# Patient Record
Sex: Male | Born: 1995 | Race: Black or African American | Hispanic: No | Marital: Married | Smoking: Current every day smoker
Health system: Southern US, Community
[De-identification: ages and names within clinical notes are randomized; demographics above are authoritative.]

## PROBLEM LIST (undated history)

## (undated) DIAGNOSIS — G039 Meningitis, unspecified: Secondary | ICD-10-CM

---
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
## 2013-03-07 ENCOUNTER — Encounter (HOSPITAL_COMMUNITY): Payer: Self-pay

## 2013-03-07 ENCOUNTER — Emergency Department (HOSPITAL_COMMUNITY)
Admission: EM | Admit: 2013-03-07 | Discharge: 2013-03-07 | Disposition: A | Discharge status: Home / Self Care | Payer: BC Managed Care – PPO | Attending: Emergency Medicine | Admitting: Emergency Medicine

## 2013-03-07 ENCOUNTER — Emergency Department (HOSPITAL_COMMUNITY): Payer: BC Managed Care – PPO

## 2013-03-07 DIAGNOSIS — Z8661 Personal history of infections of the central nervous system: Secondary | ICD-10-CM | POA: Insufficient documentation

## 2013-03-07 DIAGNOSIS — M545 Low back pain, unspecified: Secondary | ICD-10-CM | POA: Insufficient documentation

## 2013-03-07 HISTORY — DX: Meningitis, unspecified: G03.9

## 2013-03-07 MED ORDER — METHOCARBAMOL 500 MG PO TABS: 500.0000 mg | ORAL_TABLET | Freq: Three times a day (TID) | ORAL | Status: DC

## 2013-03-07 MED ORDER — HYDROCODONE-ACETAMINOPHEN 5-325 MG PO TABS: ORAL_TABLET | ORAL | Status: DC

## 2013-03-07 NOTE — ED Notes (Signed)
Pt reports low back pain for years, pain worse today, denies any new or recent injury. Mother thinks may be related to lumbar puncture pt had as a child.

## 2013-03-07 NOTE — ED Notes (Signed)
Low back pain  , Was at school and called his mother to pick him up. No known injury. Increased pain with movement.

## 2013-03-10 NOTE — ED Provider Notes (Signed)
Medical screening examination/treatment/procedure(s) were performed by non-physician practitioner and as supervising physician I was immediately available for consultation/collaboration.  Donnetta Hutching, MD 03/10/13 [phone]

## 2013-03-10 NOTE — ED Provider Notes (Signed)
History     CSN: [id number]  Arrival date & time 03/07/13  1218   First MD Initiated Contact with Patient 03/07/13 1301      Chief Complaint  Patient presents with  . Back Pain    (Consider location/radiation/quality/duration/timing/severity/associated sxs/prior treatment) Patient is a 17 y.o. male presenting with back pain. The history is provided by the patient and a parent.  Back Pain Location:  Lumbar spine Quality:  Aching Radiates to:  Does not radiate Pain severity:  Moderate Pain is:  Same all the time Onset quality:  Gradual Timing:  Intermittent Progression:  Worsening Chronicity:  Chronic Context: not falling, not jumping from heights, not lifting heavy objects, not recent injury and not twisting   Relieved by:  Nothing Worsened by:  Bending, twisting and movement Ineffective treatments:  Lying down and NSAIDs Associated symptoms: no abdominal pain, no abdominal swelling, no bladder incontinence, no bowel incontinence, no chest pain, no dysuria, no fever, no headaches, no leg pain, no numbness, no paresthesias, no pelvic pain, no tingling and no weakness   Associated symptoms comment:  Mother states he had an LP as child and concerned that the procedure caused his chronic back pain   Past Medical History  Diagnosis Date  . Meningitis     History reviewed. No pertinent past surgical history.  No family history on file.  History  Substance Use Topics  . Smoking status: Never Smoker   . Smokeless tobacco: Not on file  . Alcohol Use: No      Review of Systems  Constitutional: Negative for fever.  Respiratory: Negative for shortness of breath.   Cardiovascular: Negative for chest pain.  Gastrointestinal: Negative for vomiting, abdominal pain, constipation and bowel incontinence.  Genitourinary: Negative for bladder incontinence, dysuria, hematuria, flank pain, decreased urine volume, difficulty urinating and pelvic pain.       No perineal numbness or  incontinence of urine or feces  Musculoskeletal: Positive for back pain. Negative for joint swelling.  Skin: Negative for rash.  Neurological: Negative for tingling, weakness, numbness, headaches and paresthesias.  All other systems reviewed and are negative.    Allergies  Review of patient's allergies indicates no known allergies.  Home Medications   Current Outpatient Rx  Name  Route  Sig  Dispense  Refill  . HYDROcodone-acetaminophen (NORCO/VICODIN) 5-325 MG per tablet      Take one-two tabs po q 4-6 hrs prn pain   20 tablet   0   . methocarbamol (ROBAXIN) 500 MG tablet   Oral   Take 1 tablet (500 mg total) by mouth 3 (three) times daily.   30 tablet   0     BP 116/58  Pulse 61  Temp(Src) 98 F (36.7 C) (Oral)  Resp 18  Ht 6' (1.829 m)  Wt 166 lb (75.297 kg)  BMI 22.51 kg/m2  SpO2 100%  Physical Exam  Nursing note and vitals reviewed. Constitutional: He is oriented to person, place, and time. He appears well-developed and well-nourished. No distress.  HENT:  Head: Normocephalic and atraumatic.  Neck: Normal range of motion. Neck supple.  Cardiovascular: Normal rate, regular rhythm, normal heart sounds and intact distal pulses.   No murmur heard. Pulmonary/Chest: Effort normal and breath sounds normal. No respiratory distress.  Musculoskeletal: He exhibits tenderness. He exhibits no edema.       Lumbar back: He exhibits tenderness, bony tenderness and pain. He exhibits normal range of motion, no swelling, no deformity, no laceration and normal pulse.  Back:  ttp of the lumbar spine and paraspinal muscles.    DP pulses are brisk and symmetrical.  Distal sensation intact.  Hip Flexors/Extensors are intact  Neurological: He is alert and oriented to person, place, and time. No cranial nerve deficit or sensory deficit. He exhibits normal muscle tone. Coordination and gait normal.  Reflex Scores:      Patellar reflexes are 2+ on the right side and 2+ on the  left side.      Achilles reflexes are 2+ on the right side and 2+ on the left side. Skin: Skin is warm and dry.    ED Course  Procedures (including critical care time)  Labs Reviewed - No data to display No results found. Dg Lumbar Spine Complete  03/07/2013   *RADIOLOGY REPORT*  Clinical Data: Low back pain worse today  LUMBAR SPINE - COMPLETE 4+ VIEW  Comparison: None  Findings: Osseous mineralization normal. Five non-rib bearing lumbar vertebrae. Vertebral body and disc space heights maintained. No acute fracture, subluxation or bone destruction. No spondylolysis. SI joints symmetric.  IMPRESSION: Normal exam.   Original Report Authenticated By: Ulyses Southward, M.D.    1. Low back pain       MDM   X-ray finding discussed with the mother.     Patient has ttp of the lumbar paraspinal muscles.  No focal neuro deficits on exam.  Ambulates with a steady gait.   Doubt emergent neurological or infectious process.    Mother agrees to close f/u with his PMD       Kamarie Veno L. Trisha Mangle, PA-C 03/10/13 1614

## 2013-11-27 ENCOUNTER — Encounter (HOSPITAL_COMMUNITY): Payer: Self-pay | Admitting: Emergency Medicine

## 2013-11-27 ENCOUNTER — Emergency Department (HOSPITAL_COMMUNITY)
Admission: EM | Admit: 2013-11-27 | Discharge: 2013-11-27 | Disposition: A | Discharge status: Home / Self Care | Payer: BC Managed Care – PPO | Attending: Emergency Medicine | Admitting: Emergency Medicine

## 2013-11-27 DIAGNOSIS — Z79899 Other long term (current) drug therapy: Secondary | ICD-10-CM | POA: Insufficient documentation

## 2013-11-27 DIAGNOSIS — X500XXA Overexertion from strenuous movement or load, initial encounter: Secondary | ICD-10-CM | POA: Insufficient documentation

## 2013-11-27 DIAGNOSIS — Y990 Civilian activity done for income or pay: Secondary | ICD-10-CM | POA: Insufficient documentation

## 2013-11-27 DIAGNOSIS — S39012A Strain of muscle, fascia and tendon of lower back, initial encounter: Secondary | ICD-10-CM

## 2013-11-27 DIAGNOSIS — Z8669 Personal history of other diseases of the nervous system and sense organs: Secondary | ICD-10-CM | POA: Insufficient documentation

## 2013-11-27 DIAGNOSIS — S335XXA Sprain of ligaments of lumbar spine, initial encounter: Secondary | ICD-10-CM | POA: Insufficient documentation

## 2013-11-27 DIAGNOSIS — X503XXA Overexertion from repetitive movements, initial encounter: Secondary | ICD-10-CM | POA: Insufficient documentation

## 2013-11-27 DIAGNOSIS — Y929 Unspecified place or not applicable: Secondary | ICD-10-CM | POA: Insufficient documentation

## 2013-11-27 DIAGNOSIS — Y9389 Activity, other specified: Secondary | ICD-10-CM | POA: Insufficient documentation

## 2013-11-27 MED ORDER — IBUPROFEN 600 MG PO TABS: 600.0000 mg | ORAL_TABLET | Freq: Four times a day (QID) | ORAL | Status: DC | PRN

## 2013-11-27 MED ORDER — CYCLOBENZAPRINE HCL 5 MG PO TABS: 5.0000 mg | ORAL_TABLET | Freq: Three times a day (TID) | ORAL | Status: DC | PRN

## 2013-11-27 MED ORDER — IBUPROFEN 400 MG PO TABS
400.0000 mg | ORAL_TABLET | Freq: Once | ORAL | Status: AC
  Administered 2013-11-27: 400 mg via ORAL
  Filled 2013-11-27: qty 1

## 2013-11-27 MED ORDER — CYCLOBENZAPRINE HCL 10 MG PO TABS
10.0000 mg | ORAL_TABLET | Freq: Once | ORAL | Status: AC
  Administered 2013-11-27: 10 mg via ORAL
  Filled 2013-11-27: qty 1

## 2013-11-27 NOTE — ED Notes (Signed)
Pt c/o lower back pain x2-3 days. 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 Denies injury. Pain worse with movement.

## 2013-11-27 NOTE — ED Provider Notes (Signed)
  Medical screening examination/treatment/procedure(s) were performed by non-physician practitioner and as supervising physician I was immediately available for consultation/collaboration.     Gerhard Munchobert Torsha Lemus, MD 11/27/13 [phone]

## 2013-11-27 NOTE — Discharge Instructions (Signed)
Lumbosacral Strain Lumbosacral strain is a strain of any of the parts that make up your lumbosacral vertebrae. Your lumbosacral vertebrae are the bones that make up the lower third of your backbone. Your lumbosacral vertebrae are held together by muscles and tough, fibrous tissue (ligaments).  CAUSES  A sudden blow to your back can cause lumbosacral strain. Also, anything that causes an excessive stretch of the muscles in the low back can cause this strain. This is typically seen when people exert themselves strenuously, fall, lift heavy objects, bend, or crouch repeatedly. RISK FACTORS  Physically demanding work.  Participation in pushing or pulling sports or sports that require sudden twist of the back (tennis, golf, baseball).  Weight lifting.  Excessive lower back curvature.  Forward-tilted pelvis.  Weak back or abdominal muscles or both.  Tight hamstrings. SIGNS AND SYMPTOMS  Lumbosacral strain may cause pain in the area of your injury or pain that moves (radiates) down your leg.  DIAGNOSIS Your health care provider can often diagnose lumbosacral strain through a physical exam. In some cases, you may need tests such as X-ray exams.  TREATMENT  Treatment for your lower back injury depends on many factors that your clinician will have to evaluate. However, most treatment will include the use of anti-inflammatory medicines. HOME CARE INSTRUCTIONS   Avoid hard physical activities (tennis, racquetball, waterskiing) if you are not in proper physical condition for it. This may aggravate or create problems.  If you have a back problem, avoid sports requiring sudden body movements. Swimming and walking are generally safer activities.  Maintain good posture.  Maintain a healthy weight.  For acute conditions, you may put ice on the injured area.  Put ice in a plastic bag.  Place a towel between your skin and the bag.  Leave the ice on for 20 minutes, 2 3 times a day.  When the  low back starts healing, stretching and strengthening exercises may be recommended. SEEK MEDICAL CARE IF:  Your back pain is getting worse.  You experience severe back pain not relieved with medicines. SEEK IMMEDIATE MEDICAL CARE IF:   You have numbness, tingling, weakness, or problems with the use of your arms or legs.  There is a change in bowel or bladder control.  You have increasing pain in any area of the body, including your belly (abdomen).  You notice shortness of breath, dizziness, or feel faint.  You feel sick to your stomach (nauseous), are throwing up (vomiting), or become sweaty.  You notice discoloration of your toes or legs, or your feet get very cold. MAKE SURE YOU:   Understand these instructions.  Will watch your condition.  Will get help right away if you are not doing well or get worse. Document Released: 07/23/2005 Document Revised: 08/03/2013 Document Reviewed: 06/01/2013 [facility] Patient Information 2014 [place], [place].    Emergency Department Resource Guide 1) Find a Doctor and Pay Out of Pocket Although you won't have to find out who is covered by your insurance plan, it is a good idea to ask around and get recommendations. You will then need to call the office and see if the doctor you have chosen will accept you as a new patient and what types of options they offer for patients who are self-pay. Some doctors offer discounts or will set up payment plans for their patients who do not have insurance, but you will need to ask so you aren't surprised when you get to your appointment.  2) Contact Your Local  Health Department Not all health departments have doctors that can see patients for sick visits, but many do, so it is worth a call to see if yours does. If you don't know where your local health department is, you can check in your phone book. The CDC also has a tool to help you locate your state's health department, and many state websites also have  listings of all of their local health departments.  3) Find a Walk-in Clinic If your illness is not likely to be very severe or complicated, you may want to try a walk in clinic. These are popping up all over the country in pharmacies, drugstores, and shopping centers. They're usually staffed by nurse practitioners or physician assistants that have been trained to treat common illnesses and complaints. They're usually fairly quick and inexpensive. However, if you have serious medical issues or chronic medical problems, these are probably not your best option.  No Primary Care Doctor: - Call Health Connect at  [phone] - they can help you locate a primary care doctor that  accepts your insurance, provides certain services, etc. - Physician Referral Service- [phone]  Chronic Pain Problems: Organization         Address  Phone   Notes  [facility]  [phone] Patients need to be referred by their primary care doctor.   Medication Assistance: Organization         Address  Phone   Notes  [facility] Medication [facility] [address] [place], [place] [address] [phone] --Must be a resident of [place] [facility] [place] -- Must have NO insurance coverage whatsoever (no Medicaid/ Medicare, etc.) -- The pt. MUST have a primary care doctor that directs their care regularly and follows them in the community   MedAssist  [phone]   [employer]  [phone]    Agencies that provide inexpensive medical care: Organization         Address  Phone   Notes  [facility]  [phone]   [facility]    [phone]   [facility] [address] [place], [place] [address] [phone]   [facility] [address], [place] [phone]   Planned Parenthood    [phone]   [facility]    [phone]   [facility]  [address], [place] Phone:  [phone], Fax:  [phone] Hours of Operation:  9 am - 6 pm, M-F.  Also accepts Medicaid/Medicare and self-pay.  [facility]  [address], [place] Phone: [phone], Fax: [phone]) [phone]. Hours of Operation:  8:30 am - 5:30 pm, M-F.  Also accepts Medicaid and self-pay.  [facility] [place] [address], [place] Phone: [phone]) [phone]   [facility] [address], [place], [place] [phone], Ext. [phone] Mondays & Thursdays: 7-9 AM.  First 15 patients are seen on a first come, first serve basis.    Medicaid-accepting [facility] Providers:  Organization         Address  Phone   Notes  [facility] [address], [place] [phone] Also accepts self-pay patients.  [facility] [address], [place]  [phone]   [facility] [address], [place] [phone]  [place] [address] [place] [phone]   Lucianne Lei [address], [place]   [phone] Only accepts [place] Access [place] patients after they have their name applied to their card.   Self-Pay (no insurance) in [facility]:  Organization         Address  Phone   Notes  Sickle Cell Patients, [facility] [place] [phone]   [facility] [place] [phone]   [facility] [place]  [place], [place], [place] [phone]   [facility]/Dr. Osei-Bonsu  [address], [place] [address], [place] [phone] Phone number for both [place] and [place] locations is the same.  [facility] [address] [place] [phone]   [facility] [address],  [place] or [address] [phone]   [facility] [address], [place] [phone], phone; [phone], fax Sees patients 1st and 3rd Saturday of every month.  Must not qualify for public or private insurance (i.e. Medicaid, Medicare, [place] Health Choice, Veterans' Benefits)  Household income should be no more than 200% of the poverty level The clinic cannot treat you if you are pregnant or think you are pregnant  Sexually transmitted diseases are not treated at the clinic.    Dental Care: Organization         Address  Phone  Notes  [facility] [place] [phone] Accepts children up to age 14 who are enrolled in [place] or [place]; pregnant women with a Medicaid card; and children who have applied for Medicaid or [place] Health Choice, but were declined, whose parents can pay a reduced fee at time of service.  [facility]  [address], [place] [phone] Accepts children up to age 61 who are enrolled in [place] or [place]; pregnant women with a Medicaid card; and children who have applied for Medicaid or [place] Health Choice, but were declined, whose parents can pay a reduced fee at time of service.  [facility] Access PRO[facility]  [place] [phone] Patients are seen by appointment only. Walk-ins are not accepted. [place] will see patients 20 years of age and older. Monday - Tuesday (8am-5pm) Most Wednesdays (8:30-5pm) $30 per visit, cash only  [facility] Adult Dental Access PROGRAM  [address], [facility] [phone] Patients are seen by appointment only. Walk-ins are not accepted. [place] will see patients 61 years of age and older. One Wednesday Evening (Monthly: Volunteer Based).  $30 per visit, cash only  [place]  [phone] for adults; Children under age 8, call Graduate Pediatric Dentistry at [phone]. Children aged 28-14, please call [phone] to request a pediatric application.  Dental services are provided in all areas of dental care including fillings, crowns and bridges, complete and partial dentures, implants, gum treatment, root canals, and extractions. Preventive care is also provided. Treatment is provided to both adults and children. Patients are selected via a lottery and there is often a waiting list.   [facility] [address] [place]  [phone] [email or web address]  [facility] [address] [place], [place] [phone], Ext. [phone] Second and Fourth Thursday of each month, opens at 6:30 AM; Clinic ends at 9 AM.  Patients are seen on a first-come first-served basis, and a limited number are seen during each clinic.   [facility]  [address] Hillard Danker [place], [place] [phone]   Eligibility Requirements You must have lived in [place], [place], or [place] counties for at least the last three months.   You cannot be eligible for state or federal sponsored [employer], including [employer], [place], or [employer].   You generally cannot be eligible for healthcare insurance through your employer.    How to apply: Eligibility screenings are held every Tuesday and Wednesday afternoon from 1:00 pm until 4:00 pm. You do not need an appointment for the interview!  [facility] [address] [place], [place]   [place]  [place] Department  [place]  [phone]    Behavioral Health Resources in the Community: Intensive Outpatient Programs Organization         Address  Phone  Notes  [facility] [place]. [address] [place], [place] [phone]     [facility] Outpatient [address] [place], [place]   ADS: Alcohol [facility] Drug S[facility] [address] [place], [place]   [place] [address],  [place], [place] or [phone]   Substance Abuse Resources Organization         Address  Phone  Notes  Alcohol and Drug Services  [phone]   [place]  [phone]   [place]   Chinita Pester  [phone]   Residential & Outpatient Substance Abuse Program  [phone]   Psychological Services Organization         Address  Phone  Notes  [facility] [place]  [place]  [phone]   [place] [address] [place] or [phone]    Mobile Crisis Teams Organization         Address  Phone  Notes  [facility], Mobile Crisis Care Unit  [phone]   [facility]  [address] [place], [place]   [place] Levels [address] [place] [phone]    Self-Help/Support Groups Organization         Address  Phone             Notes  [place]. of [place] - variety of support groups  [place] Call for more information  Narcotics Anonymous (NA), [facility] [address], [employer] [place]  2 meetings at this location   Special educational needs teacher         Address  Phone  Notes  [facility] [place],    [place]  [phone]   [place]  [address] [place] [id number], [place], [place]   [place], [place] [phone] Admissions: 8am-3pm M-F  Incentives Substance [place] [address],    [place], [place] [phone]   [facility] [address] Jadene Pierini [place], [place]   [facility] [employer] [facility] [address]  [place],  [place]   [facility] - Intensive Outpatient [phone]  [address], [place], [place] [phone]   [facility] ([facility] [address]  [place], [place] [phone] or [phone]   [facility] (RTS) [address] [place], [place] [phone] Accepts Medicaid  [facility] [place] [address]  [place] [phone] Substance Abuse/Addiction Treatment   [facility] Organization         Address  Phone  Notes  [facility]  [phone]   Angie Fava, PhD [address] Ervin Knack [place], [place]   [phone] or [phone]   [facility]   [address] [place], [place] [phone]   [facility] [address] [place], [place] [phone] Insurance/Medicaid/sponsorship through [facility] and Families [address]                                    [place], [place] [phone] Therapy/tele-psych/case  [facility] [address], [place] [phone]    Dr. Lolly Mustache  [phone]   [facility]  United Way [facility] Dept. 1) [address] [place] 2) [address], [place] 3)  [address], [place] [phone]  [phone]   [facility] Child Abuse Hotline 862-715-6125 or 5801956927 (After Hours)          Do not drive within 4 hours of taking flexeril as this may make you drowsy.  Avoid lifting,  Bending,  Twisting or any other activity that worsens your pain over the next week.  Apply an  icepack  to your lower back for 10-15 minutes every 2 hours for the next 2 days.  You should get rechecked if your symptoms are not better over the next 5 days,  Or you develop increased pain,  Weakness in your leg(s) or loss of bladder or bowel function - these are symptoms of a worse injury.

## 2013-11-27 NOTE — ED Provider Notes (Signed)
CSN: [id number]     Arrival date & time 11/27/13  0907 History  This chart was scribed for non-physician practitioner, Burgess Amor, PA-C,working with Gerhard Munch, MD, by Karle Plumber, ED Scribe.  This patient was seen in room APA07/APA07 and the patient's care was started at 9:26 AM.  Chief Complaint  Patient presents with  . Back Pain   The history is provided by the patient. No language interpreter was used.   HPI Comments:  Samuel Lane is a 18 y.o. male who presents to the Emergency Department complaining of constant moderate back pain that started approximately two days ago. Pt states he works at [employer] and often lifts heavy objects on average of 50-60 pounds each. He describes the pain as sharp pain that radiated once into his upper left thigh. He states that he has had similar symptoms before secondary to working. Pt reports any type of movement, especially bending over, makes his pain worse. He states he took an Aleve this morning and an Ibuprofen yesterday with no relief. Pt denies numbness, tingling, weakness of the lower extremities, bowel or bladder incontinence, or any other urinary symptoms. Pt denies any known injury or trauma.     Past Medical History  Diagnosis Date  . Meningitis    History reviewed. No pertinent past surgical history. History reviewed. No pertinent family history. History  Substance Use Topics  . Smoking status: Never Smoker   . Smokeless tobacco: Not on file  . Alcohol Use: No    Review of Systems  Constitutional: Negative for fever.  Respiratory: Negative for shortness of breath.   Cardiovascular: Negative for chest pain and leg swelling.  Gastrointestinal: Negative for abdominal pain, constipation and abdominal distention.  Genitourinary: Negative for dysuria, urgency, frequency, flank pain and difficulty urinating.  Musculoskeletal: Positive for back pain. Negative for gait problem and joint swelling.  Skin: Negative for rash.   Neurological: Negative for weakness and numbness.    Allergies  Review of patient's allergies indicates no known allergies.  Home Medications   Current Outpatient Rx  Name  Route  Sig  Dispense  Refill  . cyclobenzaprine (FLEXERIL) 5 MG tablet   Oral   Take 1 tablet (5 mg total) by mouth 3 (three) times daily as needed for muscle spasms.   15 tablet   0   . HYDROcodone-acetaminophen (NORCO/VICODIN) 5-325 MG per tablet      Take one-two tabs po q 4-6 hrs prn pain   20 tablet   0   . ibuprofen (ADVIL,MOTRIN) 600 MG tablet   Oral   Take 1 tablet (600 mg total) by mouth every 6 (six) hours as needed.   30 tablet   0   . methocarbamol (ROBAXIN) 500 MG tablet   Oral   Take 1 tablet (500 mg total) by mouth 3 (three) times daily.   30 tablet   0    Triage Vitals: BP 135/64  Pulse 65  Temp(Src) 98.3 F (36.8 C) (Oral)  Resp 18  SpO2 100% Physical Exam  Nursing note and vitals reviewed. Constitutional: He appears well-developed and well-nourished.  HENT:  Head: Normocephalic.  Eyes: Conjunctivae are normal.  Neck: Normal range of motion. Neck supple.  Cardiovascular: Normal rate and intact distal pulses.   Pedal pulses normal.  Pulmonary/Chest: Effort normal.  Abdominal: Soft. Bowel sounds are normal. He exhibits no distension and no mass.  Musculoskeletal: Normal range of motion. He exhibits no edema.       Lumbar back:  He exhibits tenderness (mid-lumbar and left paralumbar). He exhibits no swelling, no edema and no spasm.  Neurological: He is alert. He has normal strength. He displays no atrophy and no tremor. No sensory deficit. Gait normal.  Reflex Scores:      Patellar reflexes are 2+ on the right side and 2+ on the left side.      Achilles reflexes are 2+ on the right side and 2+ on the left side. No strength deficit noted in hip and knee flexor and extensor muscle groups.  Ankle flexion and extension intact.  Skin: Skin is warm and dry.  Psychiatric: He has  a normal mood and affect.    ED Course  Procedures (including critical care time) DIAGNOSTIC STUDIES: Oxygen Saturation is 100% on RA, normal by my interpretation.   COORDINATION OF CARE: 9:34 AM- Advised pt to continue taking OTC Ibuprofen 600 mg every 6 hours scheduled. Will prescribe a muscle relaxer. Advised pt to apply heating pad or soak in hot baths to relieve symptoms. Will refer pt to orthopedic specialist if symptoms do not improve within 4-5 days. Will provide work note for light duty of lifting no more than 20 pounds. Will give pain medication prior to discharge. Pt verbalizes understanding and agrees to plan.  Medications  ibuprofen (ADVIL,MOTRIN) tablet 400 mg (not administered)  cyclobenzaprine (FLEXERIL) tablet 10 mg (not administered)    Labs Review Labs Reviewed - No data to display Imaging Review No results found.  EKG Interpretation   None       MDM   1. Lumbar strain      I personally performed the services described in this documentation, which was scribed in my presence. The recorded information has been reviewed and is accurate.  I personally performed the services described in this documentation, which was scribed in my presence. The recorded information has been reviewed and is accurate.     Burgess AmorJulie Maicey Barrientez, PA-C 11/27/13 1003

## 2014-04-25 ENCOUNTER — Encounter (HOSPITAL_COMMUNITY): Payer: Self-pay | Admitting: Emergency Medicine

## 2014-04-25 ENCOUNTER — Emergency Department (HOSPITAL_COMMUNITY)
Admission: EM | Admit: 2014-04-25 | Discharge: 2014-04-25 | Disposition: A | Discharge status: Home / Self Care | Payer: BC Managed Care – PPO | Attending: Emergency Medicine | Admitting: Emergency Medicine

## 2014-04-25 DIAGNOSIS — S335XXA Sprain of ligaments of lumbar spine, initial encounter: Secondary | ICD-10-CM | POA: Insufficient documentation

## 2014-04-25 DIAGNOSIS — Y929 Unspecified place or not applicable: Secondary | ICD-10-CM | POA: Insufficient documentation

## 2014-04-25 DIAGNOSIS — X58XXXA Exposure to other specified factors, initial encounter: Secondary | ICD-10-CM | POA: Insufficient documentation

## 2014-04-25 DIAGNOSIS — S39012A Strain of muscle, fascia and tendon of lower back, initial encounter: Secondary | ICD-10-CM

## 2014-04-25 DIAGNOSIS — Y939 Activity, unspecified: Secondary | ICD-10-CM | POA: Insufficient documentation

## 2014-04-25 MED ORDER — IBUPROFEN 400 MG PO TABS
600.0000 mg | ORAL_TABLET | Freq: Once | ORAL | Status: AC
  Administered 2014-04-25: 600 mg via ORAL
  Filled 2014-04-25: qty 2

## 2014-04-25 MED ORDER — OXYCODONE-ACETAMINOPHEN 5-325 MG PO TABS
1.0000 | ORAL_TABLET | Freq: Once | ORAL | Status: AC
  Administered 2014-04-25: 1 via ORAL
  Filled 2014-04-25: qty 1

## 2014-04-25 MED ORDER — IBUPROFEN 600 MG PO TABS: 600.0000 mg | ORAL_TABLET | Freq: Four times a day (QID) | ORAL | Status: DC | PRN

## 2014-04-25 MED ORDER — CYCLOBENZAPRINE HCL 10 MG PO TABS: 10.0000 mg | ORAL_TABLET | Freq: Three times a day (TID) | ORAL | Status: DC | PRN

## 2014-04-25 NOTE — ED Notes (Signed)
Pt has c/o of lower back pain x 1 day.

## 2014-04-25 NOTE — Discharge Instructions (Signed)
Back Exercises °Back exercises help treat and prevent back injuries. The goal of back exercises is to increase the strength of your abdominal and back muscles and the flexibility of your back. These exercises should be started when you no longer have back pain. Back exercises include: °· Pelvic Tilt. Lie on your back with your knees bent. Tilt your pelvis until the lower part of your back is against the floor. Hold this position 5 to 10 sec and repeat 5 to 10 times. °· Knee to Chest. Pull first 1 knee up against your chest and hold for 20 to 30 seconds, repeat this with the other knee, and then both knees. This may be done with the other leg straight or bent, whichever feels better. °· Sit-Ups or Curl-Ups. Bend your knees 90 degrees. Start with tilting your pelvis, and do a partial, slow sit-up, lifting your trunk only 30 to 45 degrees off the floor. Take at least 2 to 3 seconds for each sit-up. Do not do sit-ups with your knees out straight. If partial sit-ups are difficult, simply do the above but with only tightening your abdominal muscles and holding it as directed. °· Hip-Lift. Lie on your back with your knees flexed 90 degrees. Push down with your feet and shoulders as you raise your hips a couple inches off the floor; hold for 10 seconds, repeat 5 to 10 times. °· Back arches. Lie on your stomach, propping yourself up on bent elbows. Slowly press on your hands, causing an arch in your low back. Repeat 3 to 5 times. Any initial stiffness and discomfort should lessen with repetition over time. °· Shoulder-Lifts. Lie face down with arms beside your body. Keep hips and torso pressed to floor as you slowly lift your head and shoulders off the floor. °Do not overdo your exercises, especially in the beginning. Exercises may cause you some mild back discomfort which lasts for a few minutes; however, if the pain is more severe, or lasts for more than 15 minutes, do not continue exercises until you see your caregiver.  Improvement with exercise therapy for back problems is slow.  °See your caregivers for assistance with developing a proper back exercise program. °Document Released: 11/20/2004 Document Revised: 01/05/2012 Document Reviewed: 08/14/2011 °ExitCare® Patient Information ©2015 [employer]. This information is not intended to replace advice given to you by your health care provider. Make sure you discuss any questions you have with your health care provider. ° °Back Pain, Adult °Low back pain is very common. About 1 in 5 people have back pain. The cause of low back pain is rarely dangerous. The pain often gets better over time. About half of people with a sudden onset of back pain feel better in just 2 weeks. About 8 in 10 people feel better by 6 weeks.  °CAUSES °Some common causes of back pain include: °· Strain of the muscles or ligaments supporting the spine. °· Wear and tear (degeneration) of the spinal discs. °· Arthritis. °· Direct injury to the back. °DIAGNOSIS °Most of the time, the direct cause of low back pain is not known. However, back pain can be treated effectively even when the exact cause of the pain is unknown. Answering your caregiver's questions about your overall health and symptoms is one of the most accurate ways to make sure the cause of your pain is not dangerous. If your caregiver needs more information, he or she may order lab work or imaging tests (X-rays or MRIs). However, even if imaging tests show changes in your   back, this usually does not require surgery. °HOME CARE INSTRUCTIONS °For many people, back pain returns. Since low back pain is rarely dangerous, it is often a condition that people can learn to manage on their own.  °· Remain active. It is stressful on the back to sit or stand in one place. Do not sit, drive, or stand in one place for more than 30 minutes at a time. Take short walks on level surfaces as soon as pain allows. Try to increase the length of time you walk each  day. °· Do not stay in bed. Resting more than 1 or 2 days can delay your recovery. °· Do not avoid exercise or work. Your body is made to move. It is not dangerous to be active, even though your back may hurt. Your back will likely heal faster if you return to being active before your pain is gone. °· Pay attention to your body when you  bend and lift. Many people have less discomfort when lifting if they bend their knees, keep the load close to their bodies, and avoid twisting. Often, the most comfortable positions are those that put less stress on your recovering back. °· Find a comfortable position to sleep. Use a firm mattress and lie on your side with your knees slightly bent. If you lie on your back, put a pillow under your knees. °· Only take over-the-counter or prescription medicines as directed by your caregiver. Over-the-counter medicines to reduce pain and inflammation are often the most helpful. Your caregiver may prescribe muscle relaxant drugs. These medicines help dull your pain so you can more quickly return to your normal activities and healthy exercise. °· Put ice on the injured area. °¨ Put ice in a plastic bag. °¨ Place a towel between your skin and the bag. °¨ Leave the ice on for 15-20 minutes, 03-04 times a day for the first 2 to 3 days. After that, ice and heat may be alternated to reduce pain and spasms. °· Ask your caregiver about trying back exercises and gentle massage. This may be of some benefit. °· Avoid feeling anxious or stressed. Stress increases muscle tension and can worsen back pain. It is important to recognize when you are anxious or stressed and learn ways to manage it. Exercise is a great option. °SEEK MEDICAL CARE IF: °· You have pain that is not relieved with rest or medicine. °· You have pain that does not improve in 1 week. °· You have new symptoms. °· You are generally not feeling well. °SEEK IMMEDIATE MEDICAL CARE IF:  °· You have pain that radiates from your back into  your legs. °· You develop new bowel or bladder control problems. °· You have unusual weakness or numbness in your arms or legs. °· You develop nausea or vomiting. °· You develop abdominal pain. °· You feel faint. °Document Released: 10/13/2005 Document Revised: 04/13/2012 Document Reviewed: 03/03/2011 °ExitCare® Patient Information ©2015 [employer]. This information is not intended to replace advice given to you by your health care provider. Make sure you discuss any questions you have with your health care provider. ° ° °Emergency Department Resource Guide °1) Find a Doctor and Pay Out of Pocket °Although you won't have to find out who is covered by your insurance plan, it is a good idea to ask around and get recommendations. You will then need to call the office and see if the doctor you have chosen will accept you as a new patient and what types of options they offer for patients who are self-pay. Some doctors offer discounts or will   set up payment plans for their patients who do not have insurance, but you will need to ask so you aren't surprised when you get to your appointment. ° °2) Contact Your Local Health Department °Not all health departments have doctors that can see patients for sick visits, but many do, so it is worth a call to see if yours does. If you don't know where your local health department is, you can check in your phone book. The CDC also has a tool to help you locate your state's health department, and many state websites also have listings of all of their local health departments. ° °3) Find a Walk-in Clinic °If your illness is not likely to be very severe or complicated, you may want to try a walk in clinic. These are popping up all over the country in pharmacies, drugstores, and shopping centers. They're usually staffed by nurse practitioners or physician assistants that have been trained to treat common illnesses and complaints. They're usually fairly quick and inexpensive. However,  if you have serious medical issues or chronic medical problems, these are probably not your best option. ° °No Primary Care Doctor: °- Call [facility] at  832-8000 - they can help you locate a primary care doctor that  accepts your insurance, provides certain services, etc. °- Physician Referral Service- 1-800-533-3463 ° °Chronic Pain Problems: °Organization         Address  Phone   Notes  °[facility]  (336) 297-2271 Patients need to be referred by their primary care doctor.  ° °Medication Assistance: °Organization         Address  Phone   Notes  °[place] [facility] [address] °[place], [place] [address] (336) 641-8030 --Must be a resident of [place] °-- Must have NO insurance coverage whatsoever (no Medicaid/ Medicare, etc.) °-- The pt. MUST have a primary care doctor that directs their care regularly and follows them in the community °  °[facility]  (866) 331-1348   °[employer]  (888) 892-1162   ° °Agencies that provide inexpensive medical care: °Organization         Address  Phone   Notes  °[facility]  (336) 832-8035   °[facility]    (336) 832-7272   °[facility] [address] °[place], [place] [address] (336) 832-4777   °[facility] [address], °[place] (336) 271-4999   °[facility]    (336) 373-0678   °[facility]    (336) 272-1050   °[facility] ° [address], [place] Phone:  (336) 832-4444, Fax:  (336) 832-4440 Hours of Operation:  9 am - 6 pm, M-F.  Also accepts Medicaid/Medicare and self-pay.  °[facility] ° [address], [place] Phone: (336) 832-3150, Fax: (336) 832-3151. Hours of Operation:  8:30 am - 5:30 pm, M-F.  Also accepts Medicaid and self-pay.  °[facility] [place] [address], [place] Phone: (336) 878-6027   °[facility] [address], [place], [place] (336)723-1848, Ext. 123 Mondays & Thursdays: 7-9 AM.  First 15 patients are seen on a first come, first serve basis. °  ° °Medicaid-accepting [place] Providers: ° °Organization         Address  Phone   Notes  °[facility] [address],  (336) 641-2100   Also accepts self-pay patients.  °[facility] [address], [place] ° (336) 856-9996   °[facility] [address], [place] (336) 288-8857   °[facility] [address] [place] [address], [place] (336) 299-7000   °Veita Bland [address], [place]  ° (336) 373-1557 Only accepts [place] Access Medicaid patients after they have their name applied to their card.  ° °Self-Pay (no insurance) in [place]: ° °Organization         Address  Phone   Notes  °Sickle Cell Patients, [facility] [address], [place] (336) 832-1970   °[facility] [address], [place] (336) 832-4400   °[facility] ° [address], [place] (336) 992-4800   °[facility]/Dr. Osei-Bonsu ° [address] [place] [address], [place] or [address], [place] (336) 841-8500 Phone number for both [place] and [place] locations is the same.  °[facility] [address], [place] (336) 299-0000   °[facility] [address] [place] [address], [place] or [address] (336) 852-7530 °(336) 878-2260   °[facility] [address], [place] (336) 350-1642, phone; (336) 294-5005, fax Sees patients 1st and 3rd Saturday of every month.  Must not qualify for public or private insurance (i.e. Medicaid, Medicare, [place] Health Choice, Veterans' Benefits) • Household income should be no more than 200% of the poverty level •The clinic cannot treat you if you are pregnant or think you are  pregnant • Sexually transmitted diseases are not treated at the clinic.  ° ° °Dental Care: °Organization         Address  Phone  Notes  °[facility] [address], [place] (336) 641-6152 Accepts children up to age 21 who are enrolled in Medicaid or [place] Health Choice; pregnant women with a Medicaid card; and children who have applied for Medicaid or [place] Health Choice, but were declined, whose parents can pay a reduced fee at time of service.  °[facility] [place]  [address], [place] (336) 641-7733 Accepts children up to age 21 who are enrolled in Medicaid or [place] Health Choice; pregnant women with a Medicaid card; and children who have applied for Medicaid or  Health Choice, but were declined, whose parents can pay a reduced fee at time of service.  °[facility] ° [address],  (336) 641-4533 Patients are seen by appointment only. Walk-ins are not accepted. [facility] will see patients 18 years of age and older. °Monday - Tuesday (8am-5pm) °Most Wednesdays (8:30-5pm) °$30 per visit, cash only  °[facility] ° [address], [place] (336) 641-4533 Patients are seen by appointment only. Walk-ins are not accepted. [facility] will see patients 18 years of age and older. °One Wednesday Evening (Monthly: Volunteer Based).  $30 per visit, cash only  °[facility]  (919) 537-3737 for adults; Children under age 4, call Graduate Pediatric Dentistry at ([phone]) [phone]. Children aged 4-14, please call (919) [phone] to request a pediatric application. ° Dental services are provided in all areas of dental care including fillings, crowns and bridges, complete and partial dentures, implants, gum treatment, root canals, and extractions. Preventive care is also provided. Treatment is   provided to both adults and  children. °Patients are selected via a lottery and there is often a waiting list. °  °[facility] [address], °[place] [place] ° (336) 763-8833 [email or web address] °  °[facility] [address], [place], [place] (336)723-1848, Ext. 123 Second and Fourth Thursday of each month, opens at 6:30 AM; Clinic ends at 9 AM.  Patients are seen on a first-come first-served basis, and a limited number are seen during each clinic.  ° °[facility] ° [address], [place], [place] (336) 723-7904   Eligibility Requirements °You must have lived in [place], [place], or [place] counties for at least the last three months. °  You cannot be eligible for state or federal sponsored healthcare insurance, including Veterans Administration, Medicaid, or Medicare. °  You generally cannot be eligible for healthcare insurance through your employer.  °  How to apply: °Eligibility screenings are held every Tuesday and Wednesday afternoon from 1:00 pm until 4:00 pm. You do not need an appointment for the interview!  °[facility] [address], [place], [place] 336-631-2330   °[facility]  336-342-8273   °[facility]  336-703-3100   °[facility]  336-570-6415   ° °Behavioral Health Resources in the Community: °Intensive Outpatient Programs °Organization         Address  Phone  Notes  °[facility] [address], [place], [place] 336-878-6098   °[facility] [address], [place], [place] 336-832-9800   °[facility] [address], [place], [place] ° 336-882-2125   °[facility] [address],  °[place], [place] 1-800-853-5163 or 336-641-4981   °Substance Abuse Resources °Organization         Address  Phone  Notes  °[facility]  336-882-2125   °[facility]  336-784-9470   °[facility]  336-285-9073    °[facility]  336-845-3988   °Residential & Outpatient Substance Abuse Program  1-800-659-3381   °Psychological Services °Organization         Address  Phone  Notes  °[facility]  336- 832-9600   °[facility]  336- 378-7881   °[facility] [address], [place] 1-800-853-5163 or 336-641-4981   ° °Mobile Crisis Teams °Organization         Address  Phone  Notes  °[facility], Mobile Crisis Care Unit  1-877-626-1772   °Assertive °[facility] ° [address] [place], [place] 336-834-9664   °Sharon DeEsch [address] °[place] 336-554-5454   ° °Self-Help/Support Groups °Organization         Address  Phone             Notes  °[facility] [place] - variety of support groups  336- 373-1402 Call for more information  °Narcotics Anonymous (NA), [facility] [address], °[place]  2 meetings at this location  ° °Residential Treatment Programs °Organization         Address  Phone  Notes  °[facility] [address],    ° [place]  1-866-801-8205   °[facility] ° [address], [place], [place] 704-293-8524   °[facility] [address], [place] 336-845-3988 Admissions: 8am-3pm M-F  °[facility] [address]    °[place],    336-841-1104   °[facility] [address], [place], [place] 336-379-7146   °[facility] [address]  °[place], [place] 336-285-9073   °[facility] Intensive Outpatient [address], [place], [place] 336-852-3033   °[facility] [address]  °[place], [place] 1-877-615-2722 or 336-784-9470   °[facility] [address] [place], [place] 336-227-7417 Accepts Medicaid  °[facility] [address]  °[place] 1-800-659-3381 Substance Abuse/Addiction Treatment  ° °[place]  Behavioral Health Resources °Organization         Address  Phone  Notes  °[facility]  (888) 581-9988   °Julie Brannon, PhD [address] [place], [place]   (336) 349-5553 or (336) 951-0000   ° Behavioral   [address] °[place], [place] (336) 349-4454   °[facility] [address], [place], [place] (336) 342-8316 Insurance/Medicaid/sponsorship through [facility]  °[facility] [address]                                    [place], [place] (336) 342-8316 Therapy/tele-psych/case  °[facility] [address]  ° [place], [place] (336) 349-2233    °Dr. Arfeen  (336) 349-4544   °[facility] [place]  [employer] [place] [facility] 1) [address], [place] °2) [address], [place] °3)  [address], [place] (336) 349-3220 °(336) 342-7768 ° °(336) 342-8140   °[place] [facility] (336) 342-1394 or (336) 342-3537 (After Hours)    ° ° ° °

## 2014-05-09 NOTE — ED Provider Notes (Signed)
CSN: [id number]     Arrival date & time 04/25/14  0201 History   None    Chief Complaint  Patient presents with  . Back Pain     (Consider location/radiation/quality/duration/timing/severity/associated sxs/prior Treatment) HPI  18yM with lower back pain. Onset about a day ago. Gradual onset and progressively worsening. Worse with movement. No numbness, tingling or loss of strength. No urinary complaints. Has not tired taking anything. No fever or chills. Denies drug use.   Past Medical History  Diagnosis Date  . Meningitis    History reviewed. No pertinent past surgical history. History reviewed. No pertinent family history. History  Substance Use Topics  . Smoking status: Never Smoker   . Smokeless tobacco: Not on file  . Alcohol Use: No     Comment: Drinks occassionally    Review of Systems  All systems reviewed and negative, other than as noted in HPI.   Allergies  Review of patient's allergies indicates no known allergies.  Home Medications   Prior to Admission medications   Medication Sig Start Date End Date Taking? Authorizing Provider  cyclobenzaprine (FLEXERIL) 10 MG tablet Take 1 tablet (10 mg total) by mouth 3 (three) times daily as needed for muscle spasms. 04/25/14   Raeford Razor, MD  cyclobenzaprine (FLEXERIL) 5 MG tablet Take 1 tablet (5 mg total) by mouth 3 (three) times daily as needed for muscle spasms. 11/27/13   Burgess Amor, PA-C  HYDROcodone-acetaminophen (NORCO/VICODIN) 5-325 MG per tablet Take one-two tabs po q 4-6 hrs prn pain 03/07/13   Tammy L. Triplett, PA-C  ibuprofen (ADVIL,MOTRIN) 600 MG tablet Take 1 tablet (600 mg total) by mouth every 6 (six) hours as needed. 11/27/13   Burgess Amor, PA-C  ibuprofen (ADVIL,MOTRIN) 600 MG tablet Take 1 tablet (600 mg total) by mouth every 6 (six) hours as needed. 04/25/14   Raeford Razor, MD  methocarbamol (ROBAXIN) 500 MG tablet Take 1 tablet (500 mg total) by mouth 3 (three) times daily. 03/07/13   Tammy L.  Triplett, PA-C   BP 118/73  Pulse 58  Temp(Src) 97.9 F (36.6 C) (Oral)  Resp 16  Ht 5\' 11"  (1.803 m)  Wt 185 lb (83.915 kg)  BMI 25.81 kg/m2  SpO2 100% Physical Exam  Nursing note and vitals reviewed. Constitutional: He appears well-developed and well-nourished. No distress.  HENT:  Head: Normocephalic and atraumatic.  Eyes: Conjunctivae are normal. Right eye exhibits no discharge. Left eye exhibits no discharge.  Neck: Neck supple.  Cardiovascular: Normal rate, regular rhythm and normal heart sounds.  Exam reveals no gallop and no friction rub.   No murmur heard. Pulmonary/Chest: Effort normal and breath sounds normal. No respiratory distress.  Abdominal: Soft. He exhibits no distension. There is no tenderness.  Musculoskeletal: He exhibits no edema and no tenderness.  Mild tenderness to palpation across lower back both paraspinally and in midline  Neurological: He is alert. He exhibits normal muscle tone. Coordination normal.  Strength 5/5 b/l LE. Sensation intact to light touch. Gait steady.   Skin: Skin is warm and dry.  Psychiatric: He has a normal mood and affect. His behavior is normal. Thought content normal.    ED Course  Procedures (including critical care time) Labs Review Labs Reviewed - No data to display  Imaging Review No results found.   EKG Interpretation None      MDM   Final diagnoses:  Lumbosacral strain, initial encounter   18yM with lower back pain. No trauma. Afebrile. Denies IVDU. Nonfocal neuro  exam. Likely strain. Plan symptomatic tx.     Raeford RazorStephen Kenshawn Maciolek, MD 05/09/14 [phone]

## 2015-03-22 ENCOUNTER — Emergency Department (HOSPITAL_COMMUNITY): Payer: Self-pay

## 2015-03-22 ENCOUNTER — Emergency Department (HOSPITAL_COMMUNITY)
Admission: EM | Admit: 2015-03-22 | Discharge: 2015-03-22 | Disposition: A | Discharge status: Home / Self Care | Payer: Self-pay | Attending: Emergency Medicine | Admitting: Emergency Medicine

## 2015-03-22 ENCOUNTER — Encounter (HOSPITAL_COMMUNITY): Payer: Self-pay | Admitting: Emergency Medicine

## 2015-03-22 DIAGNOSIS — Z79899 Other long term (current) drug therapy: Secondary | ICD-10-CM | POA: Insufficient documentation

## 2015-03-22 DIAGNOSIS — Y9389 Activity, other specified: Secondary | ICD-10-CM | POA: Insufficient documentation

## 2015-03-22 DIAGNOSIS — S79911A Unspecified injury of right hip, initial encounter: Secondary | ICD-10-CM | POA: Insufficient documentation

## 2015-03-22 DIAGNOSIS — S8991XA Unspecified injury of right lower leg, initial encounter: Secondary | ICD-10-CM | POA: Insufficient documentation

## 2015-03-22 DIAGNOSIS — S3991XA Unspecified injury of abdomen, initial encounter: Secondary | ICD-10-CM | POA: Insufficient documentation

## 2015-03-22 DIAGNOSIS — Z8661 Personal history of infections of the central nervous system: Secondary | ICD-10-CM | POA: Insufficient documentation

## 2015-03-22 DIAGNOSIS — Y998 Other external cause status: Secondary | ICD-10-CM | POA: Insufficient documentation

## 2015-03-22 DIAGNOSIS — T148 Other injury of unspecified body region: Secondary | ICD-10-CM | POA: Insufficient documentation

## 2015-03-22 DIAGNOSIS — Y92410 Unspecified street and highway as the place of occurrence of the external cause: Secondary | ICD-10-CM | POA: Insufficient documentation

## 2015-03-22 DIAGNOSIS — T148XXA Other injury of unspecified body region, initial encounter: Secondary | ICD-10-CM

## 2015-03-22 DIAGNOSIS — T1490XA Injury, unspecified, initial encounter: Secondary | ICD-10-CM

## 2015-03-22 LAB — COMPREHENSIVE METABOLIC PANEL
ALT: 23 U/L (ref 17–63)
AST: 42 U/L — AB (ref 15–41)
Albumin: 4.9 g/dL (ref 3.5–5.0)
Alkaline Phosphatase: 58 U/L (ref 38–126)
Anion gap: 14 (ref 5–15)
BILIRUBIN TOTAL: 1 mg/dL (ref 0.3–1.2)
BUN: 16 mg/dL (ref 6–20)
CO2: 22 mmol/L (ref 22–32)
Calcium: 10 mg/dL (ref 8.9–10.3)
Chloride: 101 mmol/L (ref 101–111)
Creatinine, Ser: 1.13 mg/dL (ref 0.61–1.24)
GFR calc Af Amer: >60 mL/min (ref >60)
Glucose, Bld: 83 mg/dL (ref 65–99)
Potassium: 3.5 mmol/L (ref 3.5–5.1)
SODIUM: 137 mmol/L (ref 135–145)
TOTAL PROTEIN: 8 g/dL (ref 6.5–8.1)

## 2015-03-22 LAB — CBC
HEMATOCRIT: 47.3 % (ref 39.0–52.0)
HEMOGLOBIN: 16.4 g/dL (ref 13.0–17.0)
MCH: 27.3 pg (ref 26.0–34.0)
MCHC: 34.7 g/dL (ref 30.0–36.0)
MCV: 78.7 fL (ref 78.0–100.0)
PLATELETS: 204 10*3/uL (ref 150–400)
RBC: 6.01 MIL/uL — ABNORMAL HIGH (ref 4.22–5.81)
RDW: 13.5 % (ref 11.5–15.5)
WBC: 8.1 10*3/uL (ref 4.0–10.5)

## 2015-03-22 LAB — I-STAT CHEM 8, ED
BUN: 18 mg/dL (ref 6–20)
CALCIUM ION: 1.14 mmol/L (ref 1.12–1.23)
Chloride: 100 mmol/L — ABNORMAL LOW (ref 101–111)
Creatinine, Ser: 1.1 mg/dL (ref 0.61–1.24)
GLUCOSE: 89 mg/dL (ref 65–99)
HEMATOCRIT: 53 % — AB (ref 39.0–52.0)
Hemoglobin: 18 g/dL — ABNORMAL HIGH (ref 13.0–17.0)
Potassium: 3.3 mmol/L — ABNORMAL LOW (ref 3.5–5.1)
Sodium: 140 mmol/L (ref 135–145)
TCO2: 21 mmol/L (ref 0–100)

## 2015-03-22 LAB — SAMPLE TO BLOOD BANK

## 2015-03-22 LAB — PROTIME-INR
INR: 1.11 (ref 0.00–1.49)
PROTHROMBIN TIME: 14.5 s (ref 11.6–15.2)

## 2015-03-22 LAB — CDS SEROLOGY

## 2015-03-22 LAB — ETHANOL

## 2015-03-22 LAB — I-STAT CG4 LACTIC ACID, ED: Lactic Acid, Venous: 1.35 mmol/L (ref 0.5–2.0)

## 2015-03-22 MED ORDER — IBUPROFEN 600 MG PO TABS: 600.0000 mg | ORAL_TABLET | Freq: Four times a day (QID) | ORAL | Status: DC | PRN

## 2015-03-22 MED ORDER — IOHEXOL 300 MG/ML  SOLN
100.0000 mL | Freq: Once | INTRAMUSCULAR | Status: AC | PRN
  Administered 2015-03-22: 100 mL via INTRAVENOUS

## 2015-03-22 MED ORDER — ONDANSETRON HCL 4 MG/2ML IJ SOLN
4.0000 mg | Freq: Once | INTRAMUSCULAR | Status: AC
  Administered 2015-03-22: 4 mg via INTRAVENOUS
  Filled 2015-03-22: qty 2

## 2015-03-22 MED ORDER — OXYCODONE-ACETAMINOPHEN 5-325 MG PO TABS: 1.0000 | ORAL_TABLET | Freq: Four times a day (QID) | ORAL | Status: DC | PRN

## 2015-03-22 MED ORDER — MORPHINE SULFATE 4 MG/ML IJ SOLN
4.0000 mg | Freq: Once | INTRAMUSCULAR | Status: AC
  Administered 2015-03-22: 4 mg via INTRAVENOUS
  Filled 2015-03-22: qty 1
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
## 2015-03-22 NOTE — ED Notes (Signed)
Patient taken to CT and xray.

## 2015-03-22 NOTE — ED Notes (Signed)
No neuro consult. No ortho consult. 

## 2015-03-22 NOTE — ED Notes (Signed)
MD at bedside. 
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
## 2015-03-22 NOTE — ED Notes (Signed)
Pt ambulated with NT and this RN, pt denies any new areas of pain. PT reports he is very sore. MD informed.
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
## 2016-08-18 ENCOUNTER — Emergency Department (HOSPITAL_COMMUNITY): Payer: Self-pay

## 2016-08-18 ENCOUNTER — Encounter (HOSPITAL_COMMUNITY): Payer: Self-pay | Admitting: Emergency Medicine

## 2016-08-18 ENCOUNTER — Emergency Department (HOSPITAL_COMMUNITY)
Admission: EM | Admit: 2016-08-18 | Discharge: 2016-08-19 | Disposition: A | Discharge status: Home / Self Care | Payer: Self-pay | Attending: Emergency Medicine | Admitting: Emergency Medicine

## 2016-08-18 DIAGNOSIS — R51 Headache: Secondary | ICD-10-CM

## 2016-08-18 DIAGNOSIS — R519 Headache, unspecified: Secondary | ICD-10-CM

## 2016-08-18 DIAGNOSIS — F17290 Nicotine dependence, other tobacco product, uncomplicated: Secondary | ICD-10-CM | POA: Insufficient documentation

## 2016-08-18 DIAGNOSIS — J019 Acute sinusitis, unspecified: Secondary | ICD-10-CM | POA: Insufficient documentation

## 2016-08-18 MED ORDER — DIPHENHYDRAMINE HCL 50 MG/ML IJ SOLN
25.0000 mg | Freq: Once | INTRAMUSCULAR | Status: AC
Start: 2016-08-19 — End: 2016-08-19
  Administered 2016-08-19: 25 mg via INTRAVENOUS
  Filled 2016-08-18: qty 1

## 2016-08-18 MED ORDER — PROCHLORPERAZINE EDISYLATE 5 MG/ML IJ SOLN
10.0000 mg | Freq: Once | INTRAMUSCULAR | Status: AC
  Administered 2016-08-19: 10 mg via INTRAVENOUS
  Filled 2016-08-18: qty 2

## 2016-08-18 MED ORDER — SODIUM CHLORIDE 0.9 % IV BOLUS (SEPSIS)
1000.0000 mL | Freq: Once | INTRAVENOUS | Status: AC
  Administered 2016-08-19: 1000 mL via INTRAVENOUS

## 2016-08-18 NOTE — ED Triage Notes (Signed)
Pain on left side of head and down into neck for 1 week, sensitivity to light

## 2016-08-18 NOTE — ED Provider Notes (Signed)
[facility]-EMERGENCY DEPT Provider Note   CSN: [id number] Arrival date & time: 08/18/16  [id number]     History   Chief Complaint Chief Complaint  Patient presents with  . Headache    HPI Samuel Lane is a 20 y.o. male With no significant past medical history who presents with left-sided headache, Chills, congestion, rhinorrhea, photophobia and a cough. Patient denies a history of migraines. Patient says that for the last week, he has had pain on his left face and left-sided headache. He says that he noticed his left eye was looking red today. He denies any decreased hearing but says he is having pain around his left ear. The denies any trauma to his head. He denies any nausea, vomiting, vision changes. He reports some photophobia and photophobia. He also reports a cough. He denies the neck pain, neck stiffness, or change in mental status. He denies a numbness, tingling, or weakness of extremity's. He denies any other complaints. He describes the pain as 11 out of 10 in severity and located on his left side of his head and face.     Headache   This is a new problem. The current episode started more than 2 days ago. The problem occurs constantly. The problem has been gradually worsening. The headache is associated with bright light. The pain is located in the left unilateral, temporal and frontal region. The quality of the pain is described as dull. The pain is at a severity of 10/10 (11/10). The pain is severe. The pain radiates to the face. Associated symptoms include malaise/fatigue. Pertinent negatives include no fever, no chest pressure, no palpitations, no syncope, no shortness of breath, no nausea and no vomiting. He has tried nothing for the symptoms. The treatment provided no relief.    Past Medical History:  Diagnosis Date  . Meningitis     There are no active problems to display for this patient.   History reviewed. No pertinent surgical history.     Home Medications    Prior  to Admission medications   Not on File    Family History History reviewed. No pertinent family history.  Social History Social History  Substance Use Topics  . Smoking status: Current Every Day Smoker    Types: Cigars  . Smokeless tobacco: Never Used  . Alcohol use Yes     Comment: Drinks occassionally     Allergies   Review of patient's allergies indicates not on file.   Review of Systems Review of Systems  Constitutional: Positive for chills and malaise/fatigue. Negative for diaphoresis, fatigue and fever.  HENT: Positive for congestion, rhinorrhea, sinus pressure and sneezing. Negative for tinnitus, trouble swallowing and voice change.   Eyes: Positive for photophobia and redness. Negative for visual disturbance.  Respiratory: Negative for choking, chest tightness, shortness of breath, wheezing and stridor.   Cardiovascular: Negative for chest pain, palpitations and syncope.  Gastrointestinal: Negative for abdominal pain, constipation, diarrhea, nausea and vomiting.  Genitourinary: Negative for dysuria.  Musculoskeletal: Negative for back pain and neck pain.  Skin: Negative for rash and wound.  Neurological: Positive for headaches. Negative for seizures, weakness and light-headedness.  Psychiatric/Behavioral: Negative for agitation.  All other systems reviewed and are negative.    Physical Exam Updated Vital Signs BP 141/68 (BP Location: Left Arm)   Pulse (!) 59   Temp 98.4 F (36.9 C) (Oral)   Resp 15   Ht 6' (1.829 m)   Wt 180 lb (81.6 kg)   SpO2 99%  BMI 24.41 kg/m   Physical Exam  Constitutional: He is oriented to person, place, and time. He appears well-developed and well-nourished. No distress.  HENT:  Head: Atraumatic. Head is without Battle's sign, without contusion and without laceration.    Right Ear: Hearing and external ear normal.  Left Ear: Hearing normal. There is tenderness. No drainage. Tympanic membrane is erythematous. No decreased  hearing is noted.  Ears:  Nose: Rhinorrhea present.  Mouth/Throat: Uvula is midline and oropharynx is clear and moist. No dental abscesses, uvula swelling or lacerations. No oropharyngeal exudate or tonsillar abscesses.  Eyes: EOM are normal. Pupils are equal, round, and reactive to light. Left conjunctiva is injected. Left conjunctiva has no hemorrhage.    Neck: Normal range of motion. Neck supple.  Cardiovascular: Normal rate and regular rhythm.   No murmur heard. Pulmonary/Chest: Effort normal and breath sounds normal. No stridor. No respiratory distress. He has no wheezes. He exhibits no tenderness.  Abdominal: Soft. There is no tenderness.  Musculoskeletal: He exhibits no edema, tenderness or deformity.  Neurological: He is alert and oriented to person, place, and time. He has normal reflexes. He displays normal reflexes. No cranial nerve deficit. He exhibits normal muscle tone. Coordination normal.  Skin: Skin is warm and dry. Capillary refill takes less than 2 seconds. He is not diaphoretic. No erythema.  Psychiatric: He has a normal mood and affect.  Nursing note and vitals reviewed.    ED Treatments / Results  Labs (all labs ordered are listed, but only abnormal results are displayed) Labs Reviewed  CBC WITH DIFFERENTIAL/PLATELET - Abnormal; Notable for the following:       Result Value   Lymphs Abs 4.8 (*)    Eosinophils Absolute 1.3 (*)    All other components within normal limits  BASIC METABOLIC PANEL  I-STAT CG4 LACTIC ACID, ED    EKG  EKG Interpretation None       Radiology Dg Chest 2 View  Result Date: 08/19/2016 CLINICAL DATA:  20 year old male with cough EXAM: CHEST  2 VIEW COMPARISON:  Chest CT dated 03/22/2015 FINDINGS: The heart size and mediastinal contours are within normal limits. Both lungs are clear. The visualized skeletal structures are unremarkable. IMPRESSION: No active cardiopulmonary disease. Electronically Signed   By: Elgie Collard  M.D.   On: 08/19/2016 01:44   Ct Temporal Bones W Contrast  Result Date: 08/19/2016 CLINICAL DATA:  LEFT head pain radiating to LEFT neck for 1 week, photophobia. LEFT ear pain. History of meningitis. EXAM: CT TEMPORAL BONES WITH CONTRAST TECHNIQUE: Axial and coronal plane CT imaging of the petrous temporal bones was performed with thin-collimation image reconstruction after intravenous contrast administration. Multiplanar CT image reconstructions were also generated. CONTRAST:  ISOVUE-300 IOPAMIDOL (ISOVUE-300) INJECTION 61% COMPARISON:  None. FINDINGS: RIGHT: External auditory canal is well formed, soft tissue effacement compatible with cerumen without erosions. Tympanic membrane is not thickened or retracted. The scutum is sharp. Well aerated middle ear including Prussak's space. Ossicles are well formed and located. Patent aditus ad antrum. Well aerated mastoid air cells without coalescence. Intact tegmen tympani. Intact otic capsule with normal appearance of the inner ear structures. No internal auditory canal expansion. No definite cerebellar pontine angle masses. LEFT: External auditory canal is well formed, soft tissue effacement extending to the fundus compatible with cerumen without erosions. Tympanic membrane is not thickened or retracted. The scutum is sharp. Well aerated middle ear including Prussak's space. Ossicles are well formed and located. Patent aditus ad antrum. Well  aerated mastoid air cells without coalescence. Intact tegmen tympani. Intact otic capsule with normal appearance of the inner ear structures. No internal auditory canal expansion. No definite cerebellar pontine angle masses. Included paranasal sinuses demonstrate severe LEFT and moderate RIGHT sinusitis without expansion. No abnormal intracranial enhancement. Fullness of the nasopharyngeal soft tissues. IMPRESSION: Bilateral external auditory canal cerumen without CT findings of otitis externus. Negative CT of the  temporal bones. Severe LEFT and moderate RIGHT paranasal sinusitis. Fullness of the nasopharyngeal soft tissues suggesting recent viral illness. Electronically Signed   By: Awilda Metro M.D.   On: 08/19/2016 01:23    Procedures Procedures (including critical care time)  Medications Ordered in ED Medications  sodium chloride 0.9 % bolus 1,000 mL (0 mLs Intravenous Stopped 08/19/16 0202)  prochlorperazine (COMPAZINE) injection 10 mg (10 mg Intravenous Given 08/19/16 0001)  diphenhydrAMINE (BENADRYL) injection 25 mg (25 mg Intravenous Given 08/19/16 0001)  iopamidol (ISOVUE-300) 61 % injection 100 mL (100 mLs Intravenous Contrast Given 08/19/16 0055)  amoxicillin-clavulanate (AUGMENTIN) 875-125 MG per tablet 1 tablet (1 tablet Oral Given 08/19/16 0159)  oxyCODONE-acetaminophen (PERCOCET/ROXICET) 5-325 MG per tablet 1 tablet (1 tablet Oral Given 08/19/16 0159)  ibuprofen (ADVIL,MOTRIN) tablet 600 mg (600 mg Oral Given 08/19/16 0159)     Initial Impression / Assessment and Plan / ED Course  I have reviewed the triage vital signs and the nursing notes.  Pertinent labs & imaging results that were available during my care of the patient were reviewed by me and considered in my medical decision making (see chart for details).  Clinical Course    Samuel Lane is a 20 y.o. male With no significant past medical history who presents with left-sided headache, Chills, congestion, rhinorrhea, photophobia and a cough.  History and exam are seen above.  Patient exam revealed injected left eye. Normal extraocular movement. Normal vision. Tenderness in the left sinus area and behind the left ear in the mastoid area. Patient years were cleaned out due to cerumen and there was some erythema in the ear canal. No evidence of otitis media. Patient had normal neurologic exam. Lungs were clear to auscultation.  Given productive cough, x-ray ordered. No evidence of pneumonia was seen. Given the masculine  tenderness and significant facial tenderness, clinical concern for mastoiditis versus sinusitis. CT scan ordered and labs ordered.  Laboratory testing was gross and remarkable with nonelevated lactate and no leukocytosis. CT scan showed a severe left-sided greater than right sinusitis. Patient also given headache cocktail with mild improvement in headache.  Patient given prescription for Augmentin and discharged with plans for follow-up with PCP and return precautions for new or worsening symptoms. Patient had no other questions and was discharged in good condition.    Final Clinical Impressions(s) / ED Diagnoses   Final diagnoses:  Left temporal headache  Acute non-recurrent sinusitis, unspecified location    New Prescriptions Discharge Medication List as of 08/19/2016  1:42 AM    START taking these medications   Details  amoxicillin-clavulanate (AUGMENTIN) 875-125 MG tablet Take 1 tablet by mouth every 12 (twelve) hours., Starting Tue 08/19/2016, Print        Clinical Impression: 1. Acute non-recurrent sinusitis, unspecified location   2. Left temporal headache     Disposition: Discharge  Condition: Good  I have discussed the results, Dx and Tx plan with the pt(& family if present). He/she/they expressed understanding and agree(s) with the plan. Discharge instructions discussed at great length. Strict return precautions discussed and pt &/or family have  verbalized understanding of the instructions. No further questions at time of discharge.    Discharge Medication List as of 08/19/2016  1:42 AM    START taking these medications   Details  amoxicillin-clavulanate (AUGMENTIN) 875-125 MG tablet Take 1 tablet by mouth every 12 (twelve) hours., Starting Tue 08/19/2016, Print        Follow Up: [facility] EMERGENCY DEPARTMENT [id number] [address] [phone]  Return to ER for any new or worsening symptoms  Your  Doctor   Please call your doctor for follow up in 2-3 days if not improved     Heide Scales, MD 08/19/16 1204

## 2016-08-19 LAB — CBC WITH DIFFERENTIAL/PLATELET
Basophils Absolute: 0 10*3/uL (ref 0.0–0.1)
Basophils Relative: 0 %
EOS PCT: 12 %
Eosinophils Absolute: 1.3 10*3/uL — ABNORMAL HIGH (ref 0.0–0.7)
HCT: 44.8 % (ref 39.0–52.0)
Hemoglobin: 15.5 g/dL (ref 13.0–17.0)
Lymphocytes Relative: 46 %
Lymphs Abs: 4.8 10*3/uL — ABNORMAL HIGH (ref 0.7–4.0)
MCH: 27.7 pg (ref 26.0–34.0)
MCHC: 34.6 g/dL (ref 30.0–36.0)
MCV: 80.1 fL (ref 78.0–100.0)
MONO ABS: 0.6 10*3/uL (ref 0.1–1.0)
Monocytes Relative: 6 %
Neutro Abs: 3.8 10*3/uL (ref 1.7–7.7)
Neutrophils Relative %: 36 %
PLATELETS: 190 10*3/uL (ref 150–400)
RBC: 5.59 MIL/uL (ref 4.22–5.81)
RDW: 14 % (ref 11.5–15.5)
WBC: 10.5 10*3/uL (ref 4.0–10.5)

## 2016-08-19 LAB — BASIC METABOLIC PANEL
ANION GAP: 7 (ref 5–15)
BUN: 12 mg/dL (ref 6–20)
CALCIUM: 9.2 mg/dL (ref 8.9–10.3)
CO2: 26 mmol/L (ref 22–32)
Chloride: 103 mmol/L (ref 101–111)
Creatinine, Ser: 0.93 mg/dL (ref 0.61–1.24)
GFR calc Af Amer: >60 mL/min (ref >60)
GFR calc non Af Amer: >60 mL/min (ref >60)
GLUCOSE: 91 mg/dL (ref 65–99)
Potassium: 3.5 mmol/L (ref 3.5–5.1)
Sodium: 136 mmol/L (ref 135–145)

## 2016-08-19 LAB — I-STAT CG4 LACTIC ACID, ED: LACTIC ACID, VENOUS: 0.62 mmol/L (ref 0.5–1.9)

## 2016-08-19 MED ORDER — IBUPROFEN 400 MG PO TABS
600.0000 mg | ORAL_TABLET | Freq: Once | ORAL | Status: AC
  Administered 2016-08-19: 600 mg via ORAL
  Filled 2016-08-19: qty 2

## 2016-08-19 MED ORDER — IOPAMIDOL (ISOVUE-300) INJECTION 61%
100.0000 mL | Freq: Once | INTRAVENOUS | Status: AC | PRN
  Administered 2016-08-19: 100 mL via INTRAVENOUS

## 2016-08-19 MED ORDER — OXYCODONE-ACETAMINOPHEN 5-325 MG PO TABS
1.0000 | ORAL_TABLET | Freq: Once | ORAL | Status: AC
  Administered 2016-08-19: 1 via ORAL
  Filled 2016-08-19: qty 1

## 2016-08-19 MED ORDER — AMOXICILLIN-POT CLAVULANATE 875-125 MG PO TABS: 1.0000 | ORAL_TABLET | Freq: Two times a day (BID) | ORAL | 0 refills | Status: DC

## 2016-08-19 MED ORDER — AMOXICILLIN-POT CLAVULANATE 875-125 MG PO TABS
1.0000 | ORAL_TABLET | Freq: Once | ORAL | Status: AC
  Administered 2016-08-19: 1 via ORAL
  Filled 2016-08-19: qty 1

## 2016-08-19 NOTE — ED Provider Notes (Signed)
1:48 AM Pt is well appearing. Pain treated. Will be treated for sinusitis with augmentin. Understands to return to the ER for new or worsening symptoms   Samuel BilisKevin Rontae Inglett, MD 08/19/16 [phone]

## 2017-06-07 IMAGING — CT CT TEMPORAL BONES W/ CM
2 of 6 series · 13 of 40 positions shown, 16 images · IV contrast (APPLIED)
Comparison: None.

CLINICAL DATA: LEFT head pain radiating to LEFT neck for 1 week,
photophobia. LEFT ear pain. History of meningitis.

EXAM:
CT TEMPORAL BONES WITH CONTRAST
TECHNIQUE: Axial and coronal plane CT imaging of the petrous temporal bones was
performed with thin-collimation image reconstruction after
intravenous contrast administration. Multiplanar CT image
reconstructions were also generated.
CONTRAST:  100mL 98KQUV-D11 IOPAMIDOL (98KQUV-D11) INJECTION 61%

[Series 5: ax mag right · axial · 0.14mm/px · z∈[-309,-268]mm · 11 of 83 slices shown, 14 images]
[im 7/83  brain]
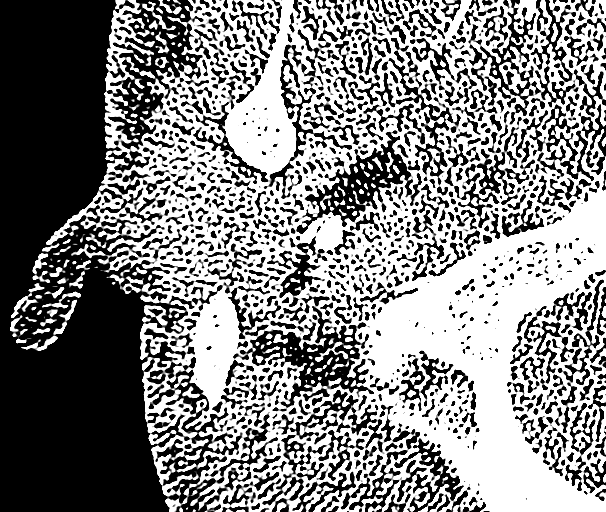
[im 7/83  bone]
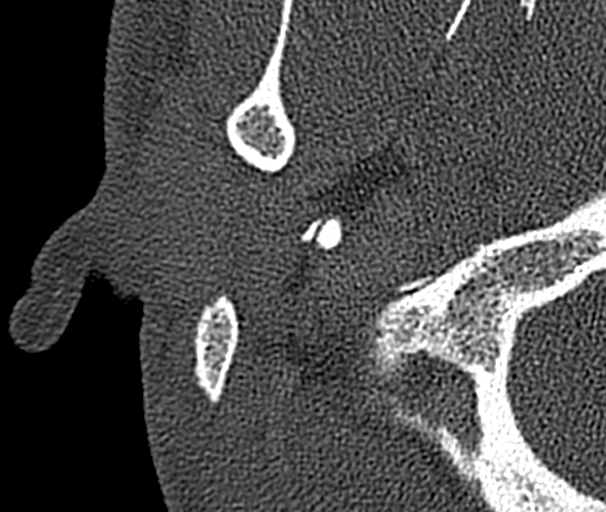
[im 14/83  bone]
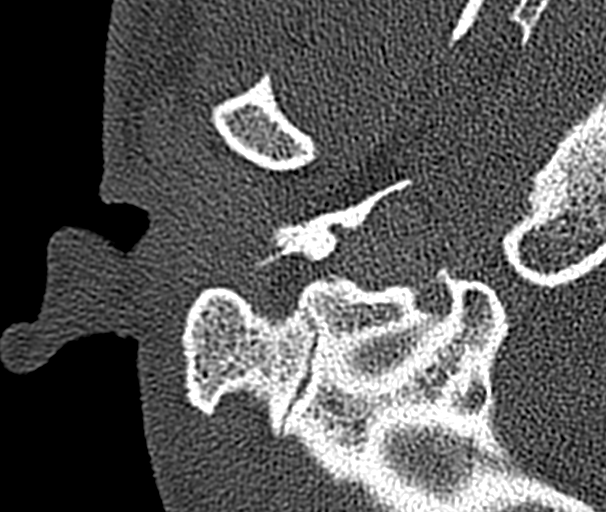
[im 21/83  bone]
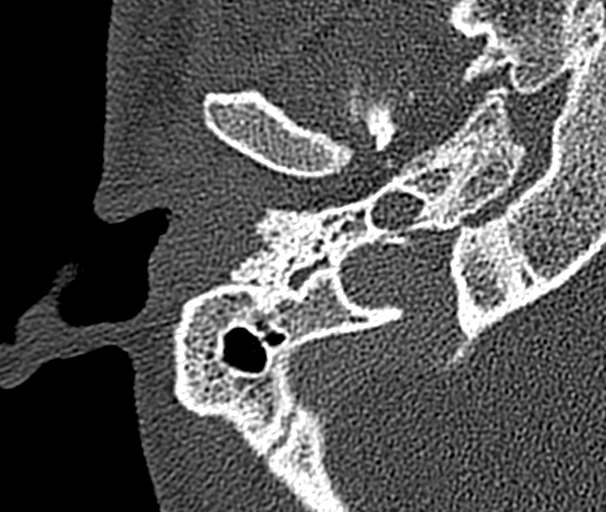
[im 28/83  bone]
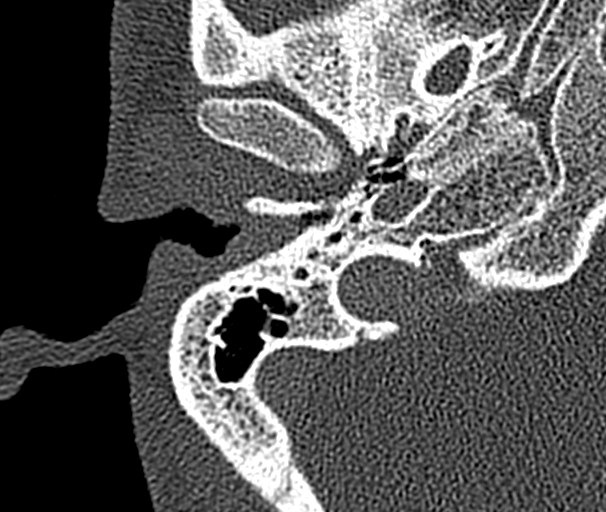
[im 35/83  brain]
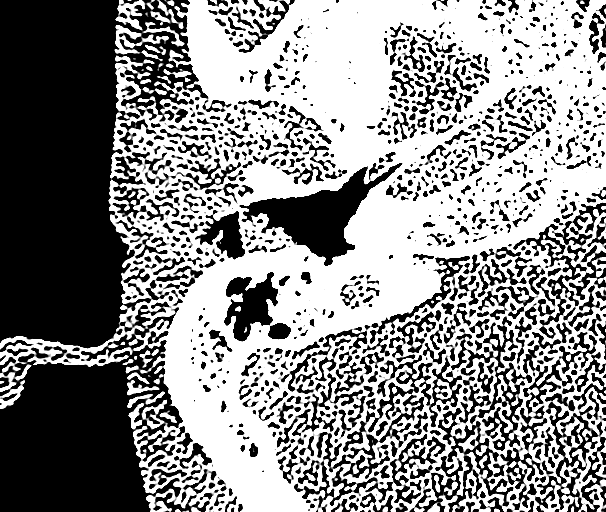
[im 35/83  bone]
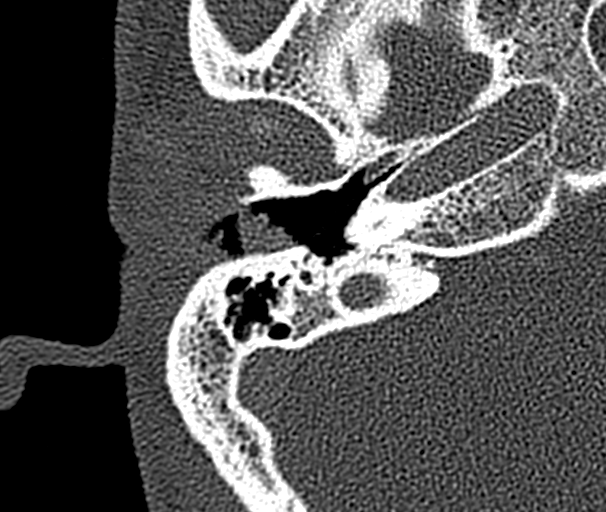
[im 42/83  bone]
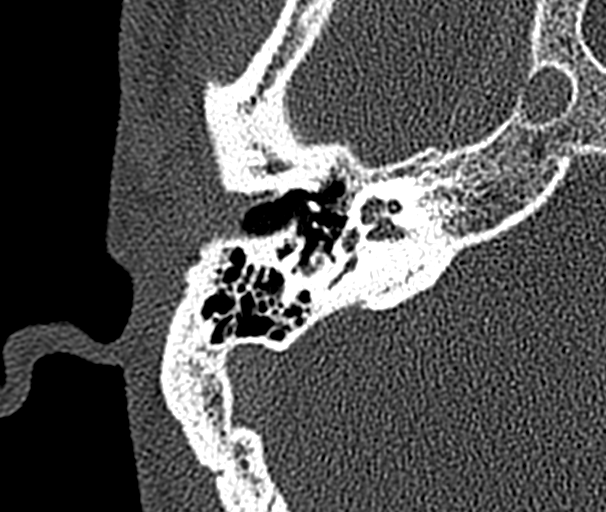
[im 48/83  bone]
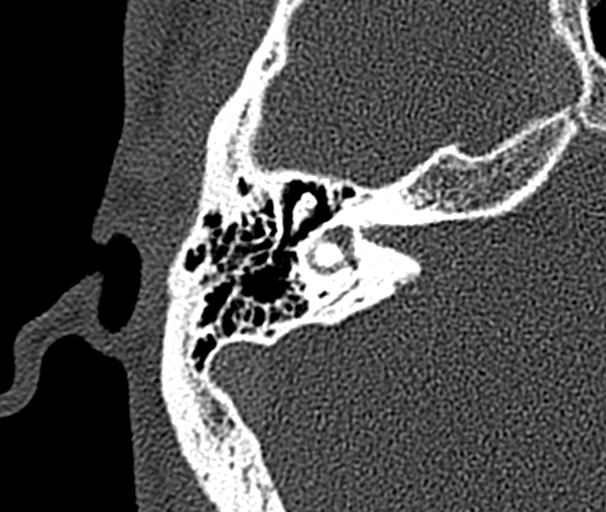
[im 55/83  bone]
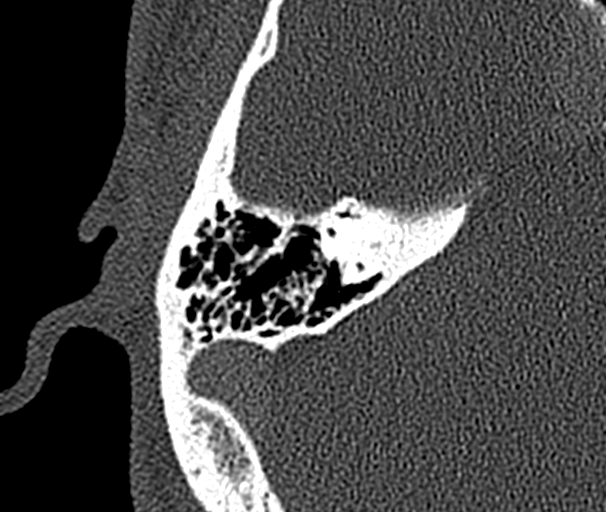
[im 62/83  brain]
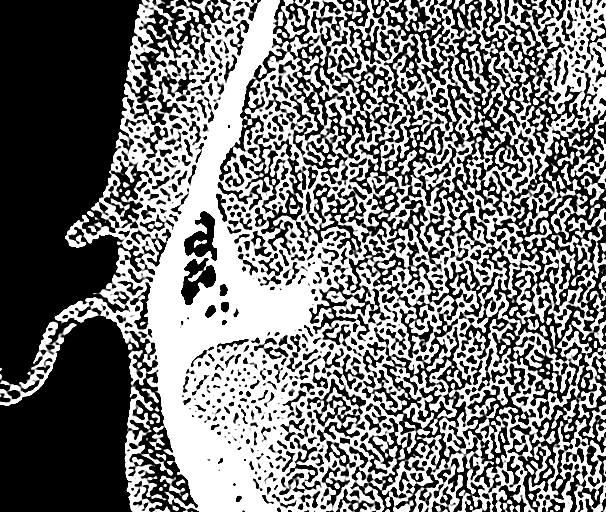
[im 62/83  bone]
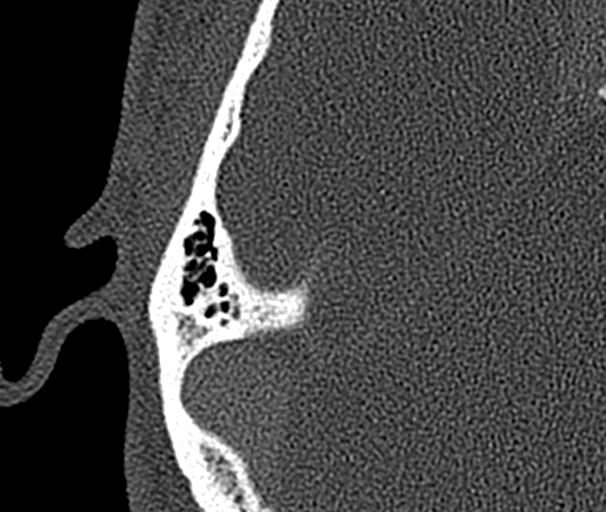
[im 69/83  bone]
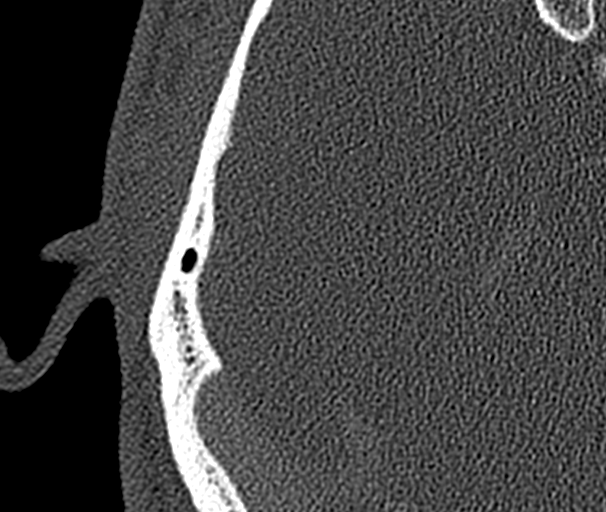
[im 76/83  bone]
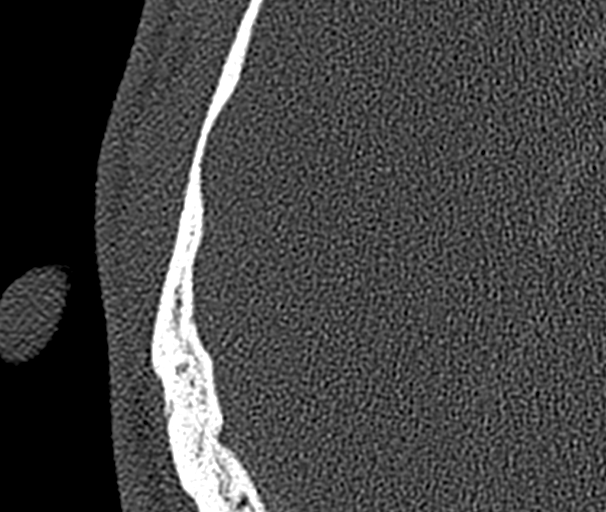

[Series 8: cor mag left · coronal · 0.14mm/px · 2 of 122 slices shown]
[im 41/122  bone]
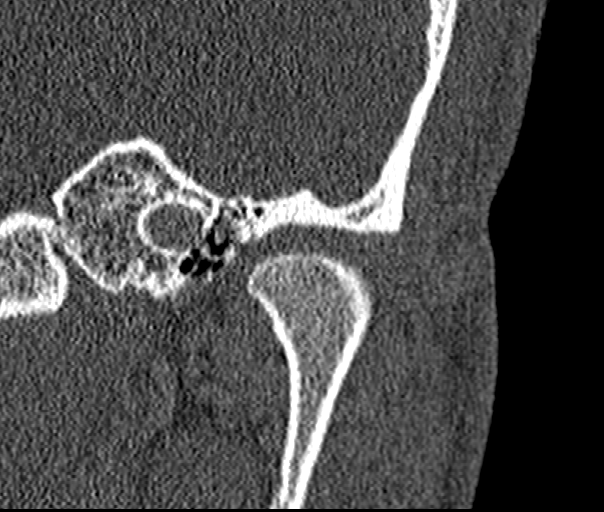
[im 81/122  bone]
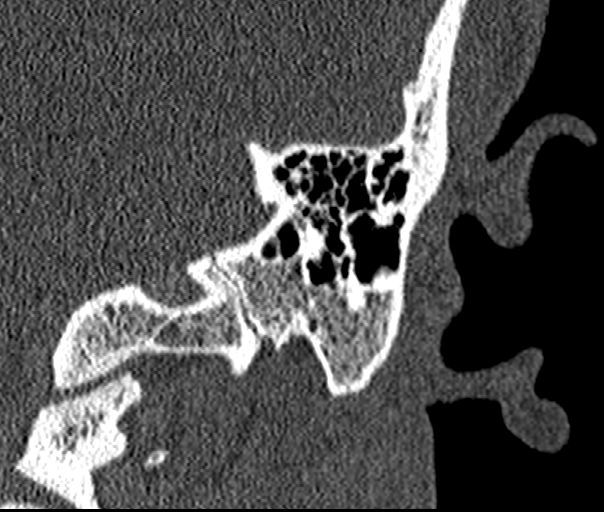

[13 of 40 positions shown; findings below may reference images not displayed]

FINDINGS: RIGHT: External auditory canal is well formed, soft tissue
effacement compatible with cerumen without erosions. Tympanic
membrane is not thickened or retracted. The scutum is sharp. Well
aerated middle ear including Prussak's space. Ossicles are well
formed and located. Patent aditus ad antrum. Well aerated mastoid
air cells without coalescence. Intact tegmen tympani. Intact otic
capsule with normal appearance of the inner ear structures. No
internal auditory canal expansion. No definite cerebellar pontine
angle masses.

LEFT: External auditory canal is well formed, soft tissue effacement
extending to the fundus compatible with cerumen without erosions.
Tympanic membrane is not thickened or retracted. The scutum is
sharp. Well aerated middle ear including Prussak's space. Ossicles
are well formed and located. Patent aditus ad antrum. Well aerated
mastoid air cells without coalescence. Intact tegmen tympani. Intact
otic capsule with normal appearance of the inner ear structures. No
internal auditory canal expansion. No definite cerebellar pontine
angle masses.

Included paranasal sinuses demonstrate severe LEFT and moderate
RIGHT sinusitis without expansion. No abnormal intracranial
enhancement. Fullness of the nasopharyngeal soft tissues.
IMPRESSION: Bilateral external auditory canal cerumen without CT findings of
otitis externus. Negative CT of the temporal bones.

Severe LEFT and moderate RIGHT paranasal sinusitis. Fullness of the
nasopharyngeal soft tissues suggesting recent viral illness.

## 2017-12-25 ENCOUNTER — Emergency Department (HOSPITAL_COMMUNITY)
Admission: EM | Admit: 2017-12-25 | Discharge: 2017-12-25 | Disposition: A | Discharge status: Home / Self Care | Payer: Self-pay | Attending: Emergency Medicine | Admitting: Emergency Medicine

## 2017-12-25 ENCOUNTER — Encounter (HOSPITAL_COMMUNITY): Payer: Self-pay

## 2017-12-25 DIAGNOSIS — R55 Syncope and collapse: Secondary | ICD-10-CM | POA: Insufficient documentation

## 2017-12-25 DIAGNOSIS — F17290 Nicotine dependence, other tobacco product, uncomplicated: Secondary | ICD-10-CM | POA: Insufficient documentation

## 2017-12-25 NOTE — ED Notes (Signed)
Pt ambulatory to waiting room. Pt verbalized understanding of discharge instructions.   

## 2017-12-25 NOTE — ED Provider Notes (Signed)
[facility] EMERGENCY DEPARTMENT Provider Note   CSN: [id number] Arrival date & time: 12/25/17  0123     History   Chief Complaint Chief Complaint  Patient presents with  . Loss of Consciousness    HPI Samuel Lane is a 22 y.o. male.  The history is provided by the patient, a significant other and a relative.  Loss of Consciousness   This is a new problem. The current episode started less than 1 hour ago. The problem occurs constantly. The problem has been resolved. He lost consciousness for a period of less than one minute. The problem is associated with normal activity. Associated symptoms include dizziness and nausea. Pertinent negatives include chest pain, fever, seizures and vomiting. He has tried nothing for the symptoms.   Reports just prior to arrival he began feeling hot, feeling nauseous, and had a brief syncopal episode.  No head trauma.  No chest pain or shortness of breath.  No seizures reported.  No tongue lacerations reported.  No recent vomiting or diarrhea Family and significant other at bedside witnessed the event.  He is now back to baseline.  He reports this is happened several years ago.  He now feels normal Past Medical History:  Diagnosis Date  . Meningitis     There are no active problems to display for this patient.   History reviewed. No pertinent surgical history.     Home Medications    Prior to Admission medications   Not on File    Family History No family history on file.  Social History Social History   Tobacco Use  . Smoking status: Current Every Day Smoker    Types: Cigars  . Smokeless tobacco: Never Used  Substance Use Topics  . Alcohol use: Yes    Comment: Drinks occassionally  . Drug use: No     Allergies   Patient has no known allergies.   Review of Systems Review of Systems  Constitutional: Negative for fever.  Respiratory: Negative for shortness of breath.   Cardiovascular: Positive for syncope. Negative for  chest pain.  Gastrointestinal: Positive for nausea. Negative for vomiting.  Neurological: Positive for dizziness and syncope. Negative for seizures.  All other systems reviewed and are negative.    Physical Exam Updated Vital Signs BP (!) 107/54 (BP Location: Right Arm)   Pulse (!) 54   Temp 97.8 F (36.6 C) (Oral)   Resp 15   Ht 1.829 m (6')   Wt 77.1 kg (170 lb)   SpO2 100%   BMI 23.06 kg/m   Physical Exam CONSTITUTIONAL: Well developed/well nourished HEAD: Normocephalic/atraumatic EYES: EOMI/PERRL ENMT: Mucous membranes moist, no tongue laceration NECK: supple no meningeal signs SPINE/BACK:entire spine nontender CV: S1/S2 noted, no murmurs/rubs/gallops noted LUNGS: Lungs are clear to auscultation bilaterally, no apparent distress ABDOMEN: soft, nontender, no rebound or guarding, bowel sounds noted throughout abdomen GU:no cva tenderness NEURO: Pt is awake/alert/appropriate, moves all extremitiesx4.  No facial droop.  No arm or leg drift.  No ataxia EXTREMITIES: pulses normal/equal, full ROM SKIN: warm, color normal PSYCH: no abnormalities of mood noted, alert and oriented to situation   ED Treatments / Results  Labs (all labs ordered are listed, but only abnormal results are displayed) Labs Reviewed - No data to display  EKG  EKG Interpretation  Date/Time:  Friday December 25 2017 02:05:40 EST Ventricular Rate:  50 PR Interval:    QRS Duration: 108 QT Interval:  428 QTC Calculation: 391 R Axis:   5  Text Interpretation:  Sinus rhythm RSR' in V1 or V2, probably normal variant ST elev, probable normal early repol pattern No previous ECGs available Confirmed by Zadie RhineWickline, Harim Bi ([id number]) on 12/25/2017 2:09:36 AM       Radiology No results found.  Procedures Procedures (including critical care time)  Medications Ordered in ED Medications - No data to display   Initial Impression / Assessment and Plan / ED Course  I have reviewed the triage vital signs and  the nursing notes.      After exam, patient is requesting discharge.  I advised that we should at least do an EKG.  His sister who is at bedside appears intoxicated and is insisting the patient be discharged   EKG reassuring.  Patient is ambulatory.  Vitals appropriate.  Will discharge home Final Clinical Impressions(s) / ED Diagnoses   Final diagnoses:  Syncope and collapse    ED Discharge Orders    None       Zadie RhineWickline, Colisha Redler, MD 12/25/17 0210

## 2017-12-25 NOTE — ED Triage Notes (Signed)
Pt was at home with family and friends, had stepped outside for a minute and when he came back in the house he mentioned he was dizzy and then "just went out" for several seconds.  Pt denies complaints or hitting head, has headache at this time only.

## 2018-05-19 ENCOUNTER — Other Ambulatory Visit: Payer: Self-pay

## 2018-05-19 ENCOUNTER — Emergency Department (HOSPITAL_COMMUNITY)
Admission: EM | Admit: 2018-05-19 | Discharge: 2018-05-19 | Disposition: A | Discharge status: Home / Self Care | Payer: Self-pay | Attending: Emergency Medicine | Admitting: Emergency Medicine

## 2018-05-19 ENCOUNTER — Encounter (HOSPITAL_COMMUNITY): Payer: Self-pay | Admitting: Emergency Medicine

## 2018-05-19 ENCOUNTER — Emergency Department (HOSPITAL_COMMUNITY): Payer: Self-pay

## 2018-05-19 DIAGNOSIS — J4521 Mild intermittent asthma with (acute) exacerbation: Secondary | ICD-10-CM | POA: Insufficient documentation

## 2018-05-19 DIAGNOSIS — F17290 Nicotine dependence, other tobacco product, uncomplicated: Secondary | ICD-10-CM | POA: Insufficient documentation

## 2018-05-19 DIAGNOSIS — J069 Acute upper respiratory infection, unspecified: Secondary | ICD-10-CM | POA: Insufficient documentation

## 2018-05-19 DIAGNOSIS — R0789 Other chest pain: Secondary | ICD-10-CM | POA: Insufficient documentation

## 2018-05-19 DIAGNOSIS — B9789 Other viral agents as the cause of diseases classified elsewhere: Secondary | ICD-10-CM

## 2018-05-19 MED ORDER — ALBUTEROL SULFATE HFA 108 (90 BASE) MCG/ACT IN AERS
1.0000 | INHALATION_SPRAY | Freq: Once | RESPIRATORY_TRACT | Status: AC
  Administered 2018-05-19: 1 via RESPIRATORY_TRACT
  Filled 2018-05-19: qty 6.7

## 2018-05-19 MED ORDER — IPRATROPIUM-ALBUTEROL 0.5-2.5 (3) MG/3ML IN SOLN
3.0000 mL | Freq: Once | RESPIRATORY_TRACT | Status: AC
  Administered 2018-05-19: 3 mL via RESPIRATORY_TRACT
  Filled 2018-05-19: qty 3

## 2018-05-19 MED ORDER — PREDNISONE 10 MG PO TABS: 40.0000 mg | ORAL_TABLET | Freq: Every day | ORAL | 0 refills | Status: AC

## 2018-05-19 MED ORDER — PREDNISONE 50 MG PO TABS
60.0000 mg | ORAL_TABLET | Freq: Once | ORAL | Status: AC
  Administered 2018-05-19: 60 mg via ORAL
  Filled 2018-05-19: qty 1

## 2018-05-19 MED ORDER — FLUTICASONE PROPIONATE 50 MCG/ACT NA SUSP: 2.0000 | Freq: Every day | NASAL | 0 refills | Status: AC

## 2018-05-19 NOTE — ED Provider Notes (Signed)
[facility] Provider Note   CSN: [id number] Arrival date & time: 05/19/18  1005     History   Chief Complaint Chief Complaint  Patient presents with  . Nasal Congestion    HPI Elta GuadeloupeJavon A Lane is a 22 y.o. male presents today for evaluation of acute onset, persisting cough and nasal congestion for 5 days.  Notes cough productive of green sputum.  Denies fever, ear pain, headaches, or sore throat.  Tolerating fluids without difficulty.  Notes chest tightness and feels somewhat short of breath.  He does have aching pain to the anterior chest wall in the back with cough and deep breathing.  Denies recent travel or surgeries, no hemoptysis, no prior history of DVT or PE, he is not on testosterone hormone placement therapy.  He smokes a few black and milds daily.  Has tried DayQuil without relief of his symptoms.  States he had asthma as a child and symptoms of chest tightness and pain feel consistent with prior asthma flares. The history is provided by the patient.    Past Medical History:  Diagnosis Date  . Meningitis     There are no active problems to display for this patient.   History reviewed. No pertinent surgical history.      Home Medications    Prior to Admission medications   Medication Sig Start Date End Date Taking? Authorizing Provider  fluticasone (FLONASE) 50 MCG/ACT nasal spray Place 2 sprays into both nostrils daily. 05/19/18   Luevenia MaxinFawze, Katessa Attridge A, PA-C  predniSONE (DELTASONE) 10 MG tablet Take 4 tablets (40 mg total) by mouth daily with breakfast for 5 days. 05/19/18 05/24/18  Jeanie SewerFawze, Kip Cropp A, PA-C    Family History History reviewed. No pertinent family history.  Social History Social History   Tobacco Use  . Smoking status: Current Every Day Smoker    Types: Cigars  . Smokeless tobacco: Never Used  Substance Use Topics  . Alcohol use: Yes    Comment: Drinks occassionally  . Drug use: No     Allergies   Patient has no known  allergies.   Review of Systems Review of Systems  Constitutional: Negative for chills and fever.  HENT: Positive for congestion. Negative for ear pain and sore throat.   Respiratory: Positive for chest tightness, shortness of breath and wheezing.   Cardiovascular: Negative for leg swelling.  Gastrointestinal: Negative for abdominal pain and vomiting.     Physical Exam Updated Vital Signs BP (!) 108/57 (BP Location: Right Arm)   Pulse 69   Temp 98.5 F (36.9 C) (Oral)   Resp 18   Wt 77.1 kg (170 lb)   SpO2 95%   BMI 23.06 kg/m   Physical Exam  Constitutional: He appears well-developed and well-nourished. No distress.  HENT:  Head: Normocephalic and atraumatic.  TMs without erythema or bulging bilaterally.  Nasal septum midline with mucosal edema bilaterally, left worse than right.  No frontal or axillary sinus tenderness.  Posterior Chalmers GuestFranks without tonsillar hypertrophy, exudates, uvular deviation, or erythema.  No trismus or sublingual abnormalities.  Tolerating secretions without difficulty.  Eyes: Pupils are equal, round, and reactive to light. Conjunctivae and EOM are normal. Right eye exhibits no discharge. Left eye exhibits no discharge.  Neck: Normal range of motion. Neck supple. No JVD present. No tracheal deviation present.  Cardiovascular: Normal rate, regular rhythm and normal heart sounds.  Pulmonary/Chest: Effort normal. He has wheezes. He exhibits tenderness.  Equal rise and fall of chest, no increased  work of breathing.  Speaking in full sentences without difficulty.  Diffuse expiratory wheezing noted on auscultation of the lungs.  There is diffuse tenderness to palpation of the anterior and posterior chest wall with no deformity, crepitus, ecchymosis, or flail segment  Abdominal: He exhibits no distension.  Musculoskeletal: He exhibits no edema.  Lymphadenopathy:    He has no cervical adenopathy.  Neurological: He is alert.  Skin: Skin is warm and dry. No  erythema.  Psychiatric: He has a normal mood and affect. His behavior is normal.  Nursing note and vitals reviewed.    ED Treatments / Results  Labs (all labs ordered are listed, but only abnormal results are displayed) Labs Reviewed - No data to display  EKG None  Radiology Dg Chest 2 View  Result Date: 05/19/2018 CLINICAL DATA:  Chest tightness with deep breath and cough. EXAM: CHEST - 2 VIEW COMPARISON:  08/19/2016 FINDINGS: Heart and mediastinal contours are within normal limits. No focal opacities or effusions. No acute bony abnormality. IMPRESSION: No active cardiopulmonary disease. Electronically Signed   By: Charlett Nose M.D.   On: 05/19/2018 11:34    Procedures Procedures (including critical care time)  Medications Ordered in ED Medications  ipratropium-albuterol (DUONEB) 0.5-2.5 (3) MG/3ML nebulizer solution 3 mL (3 mLs Nebulization Given 05/19/18 1256)  ipratropium-albuterol (DUONEB) 0.5-2.5 (3) MG/3ML nebulizer solution 3 mL (3 mLs Nebulization Given 05/19/18 1347)  predniSONE (DELTASONE) tablet 60 mg (60 mg Oral Given 05/19/18 1333)  albuterol (PROVENTIL HFA;VENTOLIN HFA) 108 (90 Base) MCG/ACT inhaler 1 puff (1 puff Inhalation Given 05/19/18 1333)     Initial Impression / Assessment and Plan / ED Course  I have reviewed the triage vital signs and the nursing notes.  Pertinent labs & imaging results that were available during my care of the patient were reviewed by me and considered in my medical decision making (see chart for details).     Patient with nasal congestion and cough.  He is afebrile, vital signs are stable.  Chest pain is reproducible on palpation, suspect musculoskeletal pain secondary to persistent cough, highly doubt cardiac etiology.  He does have wheezing on auscultation of the lungs.  This did improve but did not entirely resolve with 1 breathing treatment.  He did have significant improvement with a second breathing treatment and oral prednisone.   Chest x-ray shows no acute cardiopulmonary abnormalities.  No evidence of pneumonia.  Suspect viral URI causing asthma exacerbation.  Encourage smoking cessation.  He exhibits no increased work of breathing or hypoxia on exam.  Stable for discharge home with albuterol inhaler, prednisone burst, and symptomatic treatment.  Recommend follow-up with PCP if symptoms persist.  Discussed strict ED return precautions.  Patient and patient's family and wife verbalized understanding of and agreement with plan and patient stable for discharge home at this time.  Final Clinical Impressions(s) / ED Diagnoses   Final diagnoses:  Viral URI with cough  Mild intermittent asthma with exacerbation  Chest wall pain    ED Discharge Orders        Ordered    fluticasone (FLONASE) 50 MCG/ACT nasal spray  Daily     05/19/18 1355    predniSONE (DELTASONE) 10 MG tablet  Daily with breakfast     05/19/18 1355       Rome Echavarria, Brighton A, PA-C 05/21/18 2536    Loren Racer, MD 05/22/18 [phone]

## 2018-05-19 NOTE — Discharge Instructions (Signed)
Drink plenty water and get plenty of rest.  Use albuterol inhaler as needed for shortness of breath.  Take prednisone as prescribed beginning tomorrow.  You received the first dose in the emergency department today.  Gargle warm salt water and spit it out for sore throat. May also use cough drops, warm teas, etc. Take flonase to decrease nasal congestion.  Can also take over-the-counter allergy medicines or cold medicines for nasal congestion and scratchy throat.  [phone] mg of Tylenol every 6 hours as needed for pain. Do not exceed 4000 mg of Tylenol daily.   Followup with your primary care doctor in 5-7 days for recheck of ongoing symptoms. Return to emergency department for emergent changing or worsening of symptoms such as ongoing shortness of breath despite use of albuterol inhaler or using albuterol inhaler more than prescribed, throat tightness, facial swelling, fever not controlled by ibuprofen or Tylenol,difficulty breathing, or chest pain.

## 2018-05-19 NOTE — ED Triage Notes (Signed)
Pt reports nasal congestion for several days. Today having chest pain with deep breath. Pt reports coughing up green sputum.

## 2018-05-19 NOTE — ED Notes (Signed)
Have paged respiratory  

## 2019-03-07 IMAGING — DX DG CHEST 2V
2 series · 2 of 2 positions shown · non-contrast
Comparison: 08/19/2016

CLINICAL DATA: Chest tightness with deep breath and cough.

EXAM:
CHEST - 2 VIEW

[chest pa]
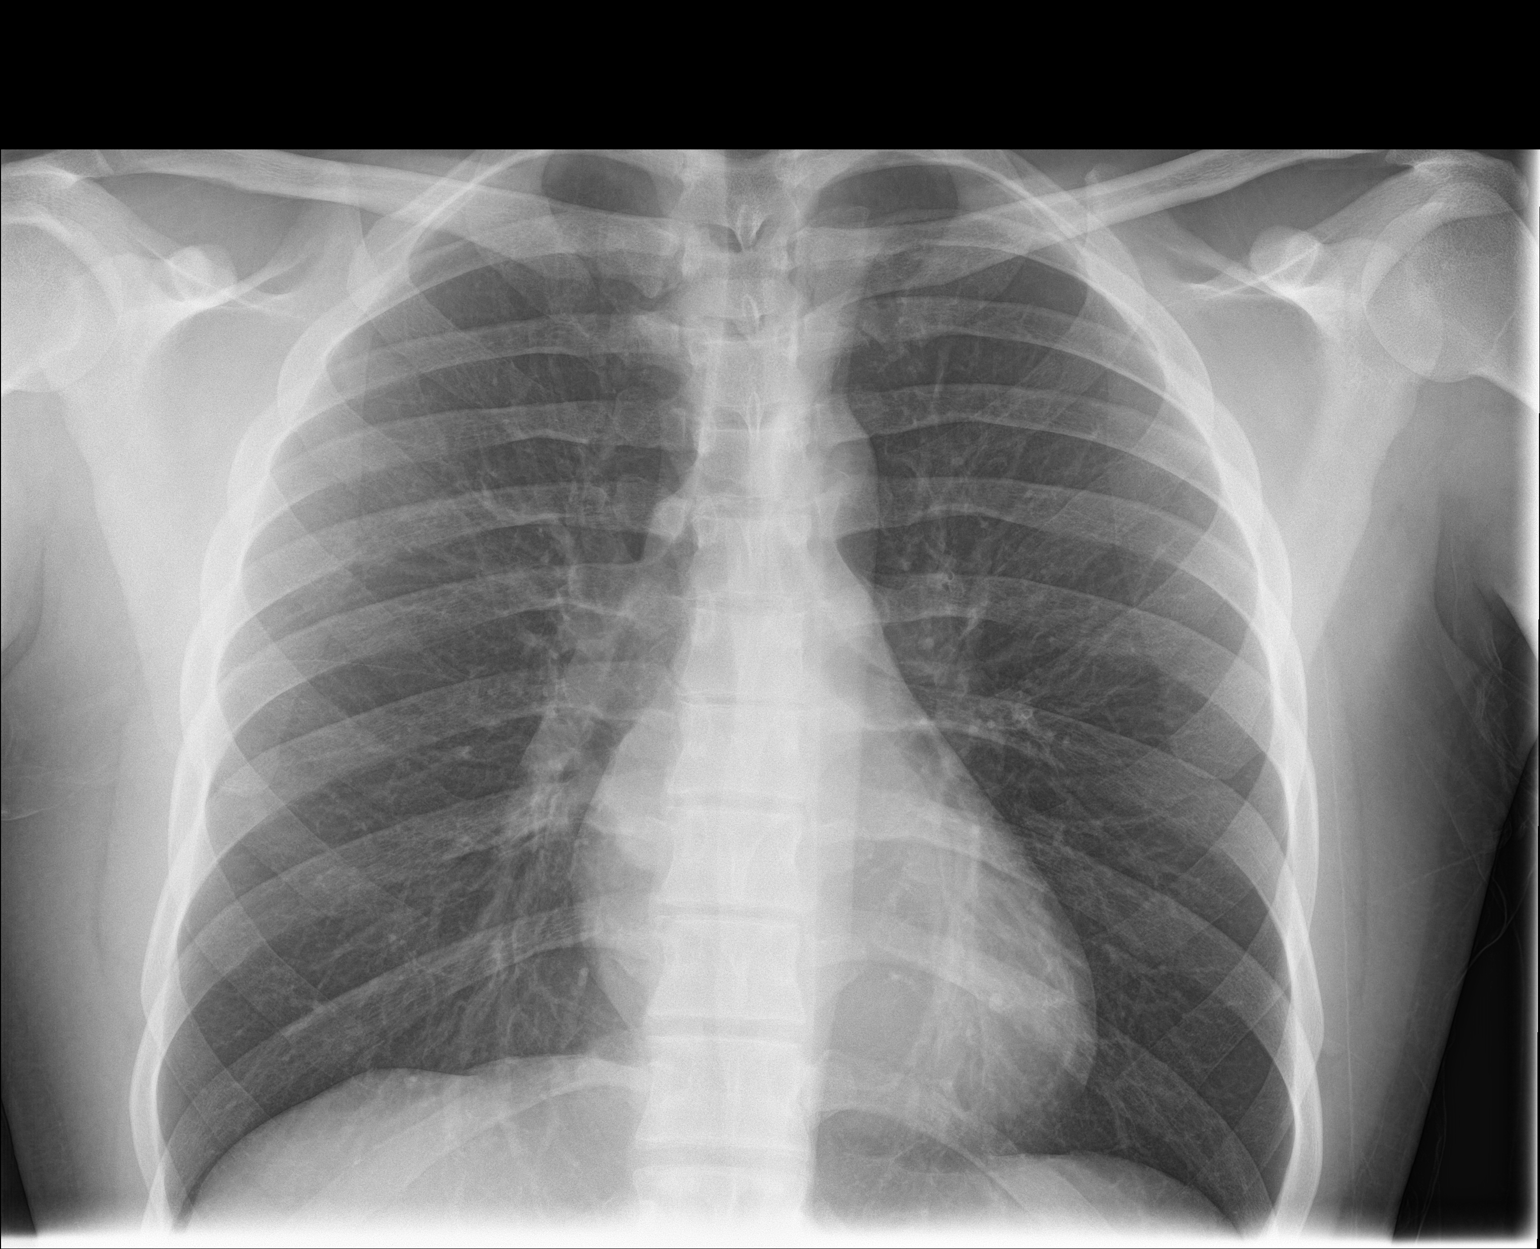

[chest lat]
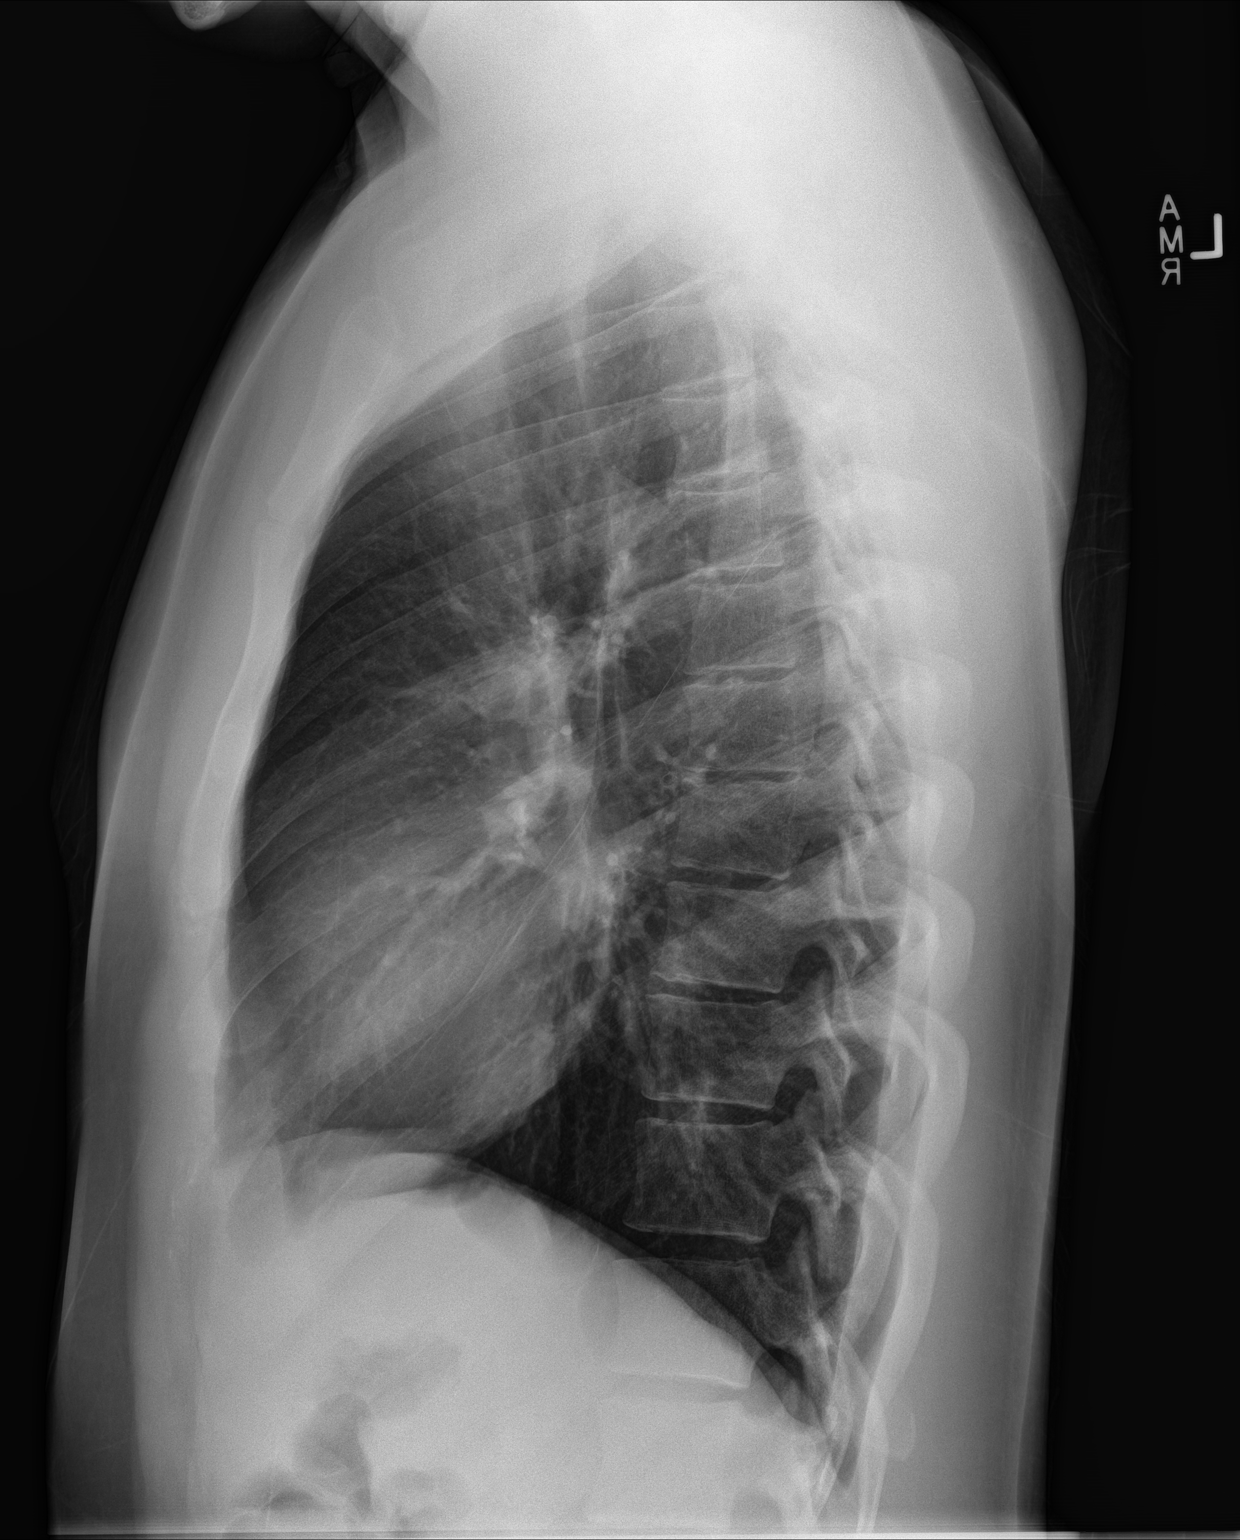

[2 of 2 positions shown; findings below may reference images not displayed]

FINDINGS: Heart and mediastinal contours are within normal limits. No focal
opacities or effusions. No acute bony abnormality.
IMPRESSION: No active cardiopulmonary disease.

## 2023-06-24 ENCOUNTER — Ambulatory Visit: Payer: Self-pay | Admitting: Nurse Practitioner

## 2024-11-05 ENCOUNTER — Encounter (HOSPITAL_COMMUNITY): Payer: Self-pay

## 2024-11-05 ENCOUNTER — Telehealth (HOSPITAL_COMMUNITY): Payer: Self-pay

## 2024-11-05 ENCOUNTER — Ambulatory Visit (HOSPITAL_COMMUNITY)
Admission: EM | Admit: 2024-11-05 | Discharge: 2024-11-05 | Disposition: A | Discharge status: Home / Self Care | Attending: Emergency Medicine | Admitting: Emergency Medicine

## 2024-11-05 DIAGNOSIS — K029 Dental caries, unspecified: Secondary | ICD-10-CM | POA: Diagnosis not present

## 2024-11-05 MED ORDER — IBUPROFEN 800 MG PO TABS: 800.0000 mg | ORAL_TABLET | Freq: Three times a day (TID) | ORAL | 0 refills | Status: AC

## 2024-11-05 MED ORDER — AMOXICILLIN-POT CLAVULANATE 875-125 MG PO TABS: 1.0000 | ORAL_TABLET | Freq: Two times a day (BID) | ORAL | 0 refills | Status: AC

## 2024-11-05 NOTE — ED Provider Notes (Signed)
 " [facility]    CSN: [id number] Arrival date & time: 11/05/24  1332      History   Chief Complaint Chief Complaint  Patient presents with   Dental Pain    HPI Samuel Lane is a 29 y.o. male.   Patient presents with on and off right sided upper and lower dental pain that began a few months ago.  Patient reports that he does have an appointment with a dentist this upcoming Wednesday for further evaluation of this, but states that he has been taking Tylenol  and occasionally ibuprofen  with minimal relief. Patient denies any fever.  The history is provided by the patient and medical records.  Dental Pain   Past Medical History:  Diagnosis Date   Meningitis     There are no active problems to display for this patient.   History reviewed. No pertinent surgical history.     Home Medications    Prior to Admission medications  Medication Sig Start Date End Date Taking? Authorizing Provider  amoxicillin -clavulanate (AUGMENTIN ) 875-125 MG tablet Take 1 tablet by mouth every 12 (twelve) hours. 11/05/24  Yes Johnie, Partick Musselman A, NP  ibuprofen  (ADVIL ) 800 MG tablet Take 1 tablet (800 mg total) by mouth 3 (three) times daily. 11/05/24  Yes Johnie, Raynee Mccasland A, NP  fluticasone  (FLONASE ) 50 MCG/ACT nasal spray Place 2 sprays into both nostrils daily. 05/19/18   Meryle Josetta LABOR, PA-C    Family History History reviewed. No pertinent family history.  Social History Social History[1]   Allergies   Patient has no known allergies.   Review of Systems Review of Systems  Per HPI  Physical Exam Triage Vital Signs ED Triage Vitals [11/05/24 1409]  Encounter Vitals Group     BP 110/67     Girls Systolic BP Percentile      Girls Diastolic BP Percentile      Boys Systolic BP Percentile      Boys Diastolic BP Percentile      Pulse Rate (!) 57     Resp 16     Temp 98.3 F (36.8 C)     Temp Source Oral     SpO2 97 %     Weight      Height      Head Circumference       Peak Flow      Pain Score      Pain Loc      Pain Education      Exclude from Growth Chart    No data found.  Updated Vital Signs BP 110/67 (BP Location: Left Arm)   Pulse (!) 57   Temp 98.3 F (36.8 C) (Oral)   Resp 16   SpO2 97%   Visual Acuity Right Eye Distance:   Left Eye Distance:   Bilateral Distance:    Right Eye Near:   Left Eye Near:    Bilateral Near:     Physical Exam Vitals and nursing note reviewed.  Constitutional:      General: He is awake. He is not in acute distress.    Appearance: Normal appearance. He is well-developed and well-groomed. He is not ill-appearing.  HENT:     Mouth/Throat:     Dentition: Abnormal dentition. Dental tenderness and dental caries present. No gingival swelling.      Comments: Dental caries noted to right upper and lower molars.  With a few fillings present.  Right lower third molar is broken as well. Skin:  General: Skin is warm and dry.  Neurological:     Mental Status: He is alert.  Psychiatric:        Behavior: Behavior is cooperative.      UC Treatments / Results  Labs (all labs ordered are listed, but only abnormal results are displayed) Labs Reviewed - No data to display  EKG   Radiology No results found.  Procedures Procedures (including critical care time)  Medications Ordered in UC Medications - No data to display  Initial Impression / Assessment and Plan / UC Course  I have reviewed the triage vital signs and the nursing notes.  Pertinent labs & imaging results that were available during my care of the patient were reviewed by me and considered in my medical decision making (see chart for details).     Patient is overall well-appearing.  Vitals are stable.  Prescribed Augmentin  for dental infection coverage related pain.  Prescribed 800 mg ibuprofen  as needed for pain.  Recommended taking Tylenol  as needed for breakthrough pain.  Discussed follow-up and return precautions. Final Clinical  Impressions(s) / UC Diagnoses   Final diagnoses:  Pain due to dental caries     Discharge Instructions      Start taking Augmentin  twice daily for 7 days for dental infection coverage related to pain. You can take 800 mg ibuprofen  every 8 hours as needed for pain. You can take 500 to 1000 mg of Tylenol  every 6-8 hours as needed for breakthrough pain. Keep your appointment with your dentist on Wednesday for further evaluation and management of this.   ED Prescriptions     Medication Sig Dispense Auth. Provider   amoxicillin -clavulanate (AUGMENTIN ) 875-125 MG tablet Take 1 tablet by mouth every 12 (twelve) hours. 14 tablet Johnie, Nekhi Liwanag A, NP   ibuprofen  (ADVIL ) 800 MG tablet Take 1 tablet (800 mg total) by mouth 3 (three) times daily. 21 tablet Johnie Flaming A, NP      PDMP not reviewed this encounter.    [1]  Social History Tobacco Use   Smoking status: Every Day    Types: Cigars   Smokeless tobacco: Never  Substance Use Topics   Alcohol use: Yes    Comment: Drinks occassionally   Drug use: No     Johnie Flaming LABOR, NP 11/05/24 1436  "

## 2024-11-05 NOTE — Discharge Instructions (Addendum)
 Start taking Augmentin  twice daily for 7 days for dental infection coverage related to pain. You can take 800 mg ibuprofen  every 8 hours as needed for pain. You can take 500 to 1000 mg of Tylenol  every 6-8 hours as needed for breakthrough pain. Keep your appointment with your dentist on Wednesday for further evaluation and management of this.

## 2024-11-05 NOTE — ED Triage Notes (Signed)
 Patient presents to the office for right  side tooth pain. PT has been taking Tylenol .
